# Patient Record
Sex: Male | Born: 1975 | Hispanic: No | Marital: Married | Smoking: Never smoker
Health system: Southern US, Community
[De-identification: ages and names within clinical notes are randomized; demographics above are authoritative.]

## PROBLEM LIST (undated history)

## (undated) DIAGNOSIS — R030 Elevated blood-pressure reading, without diagnosis of hypertension: Principal | ICD-10-CM

## (undated) DIAGNOSIS — K219 Gastro-esophageal reflux disease without esophagitis: Secondary | ICD-10-CM

## (undated) DIAGNOSIS — B019 Varicella without complication: Secondary | ICD-10-CM

## (undated) DIAGNOSIS — T7840XA Allergy, unspecified, initial encounter: Secondary | ICD-10-CM

## (undated) DIAGNOSIS — I10 Essential (primary) hypertension: Secondary | ICD-10-CM

## (undated) HISTORY — DX: Elevated blood-pressure reading, without diagnosis of hypertension: R03.0

## (undated) HISTORY — DX: Essential (primary) hypertension: I10

## (undated) HISTORY — DX: Gastro-esophageal reflux disease without esophagitis: K21.9

## (undated) HISTORY — DX: Allergy, unspecified, initial encounter: T78.40XA

## (undated) HISTORY — PX: NO PAST SURGERIES: SHX2092

## (undated) HISTORY — DX: Varicella without complication: B01.9

---

## 2011-11-21 ENCOUNTER — Ambulatory Visit (INDEPENDENT_AMBULATORY_CARE_PROVIDER_SITE_OTHER): Payer: BC Managed Care – PPO | Admitting: Internal Medicine

## 2011-11-21 ENCOUNTER — Encounter: Payer: Self-pay | Admitting: Internal Medicine

## 2011-11-21 VITALS — BP 120/80 | HR 52 | Temp 97.9°F | Ht 74.0 in | Wt 204.0 lb

## 2011-11-21 DIAGNOSIS — J309 Allergic rhinitis, unspecified: Secondary | ICD-10-CM

## 2011-11-21 DIAGNOSIS — Z0000 Encounter for general adult medical examination without abnormal findings: Secondary | ICD-10-CM | POA: Insufficient documentation

## 2011-11-21 DIAGNOSIS — Z01810 Encounter for preprocedural cardiovascular examination: Secondary | ICD-10-CM | POA: Insufficient documentation

## 2011-11-21 HISTORY — DX: Encounter for general adult medical examination without abnormal findings: Z00.00

## 2011-11-21 HISTORY — DX: Allergic rhinitis, unspecified: J30.9

## 2011-11-21 NOTE — Progress Notes (Signed)
  Subjective:    Patient ID: Shawn Webb, male    DOB: 10/02/1975, 36 y.o.   MRN: [id number]  HPI New patient , CPX  Past medical history Migraines Allergies, on OTCs  Past surgical history No major   Social history teacher , high school, computer science Married , 1 daughter tobacco-- never ETOH-- socially Diet-- healthy most of the time  Exercise-- active, hikes regulalrly  Family history Diabetes-- GP CAD--no Stroke--no Colon cancer--no Prostate cancer--no   Review of Systems  Respiratory: Negative for cough and shortness of breath.   Cardiovascular: Negative for chest pain and leg swelling.  Gastrointestinal: Negative for abdominal pain and blood in stool.  Genitourinary: Negative for dysuria, hematuria and difficulty urinating.  Psychiatric/Behavioral:       No depression or anxiety        Objective:   Physical Exam General -- alert, well-developed, and well-nourished.   Neck --no thyromegaly  Lungs -- normal respiratory effort, no intercostal retractions, no accessory muscle use, and normal breath sounds.   Heart-- normal rate, regular rhythm, no murmur, and no gallop.   Abdomen--soft, non-tender, no distention, no masses, no HSM, no guarding, and no rigidity.   Extremities-- no pretibial edema bilaterally Neurologic-- alert & oriented X3 and strength normal in all extremities. Psych-- Cognition and judgment appear intact. Alert and cooperative with normal attention span and concentration.  not anxious appearing and not depressed appearing.       Assessment & Plan:

## 2011-11-21 NOTE — Patient Instructions (Signed)
Come back fasting: FLP CBC CMP TSH--- dx V70

## 2011-11-21 NOTE — Assessment & Plan Note (Signed)
Currently on OTC, symptoms increase seasonally. Also symptoms are worse at home, he has 2 dogs and one cat. We discussed possible a nasal steroids, he will call if interested

## 2011-11-21 NOTE — Assessment & Plan Note (Signed)
Tdap today Never had a Cscope Continue w/ his healthy life style STE Labs

## 2011-11-23 ENCOUNTER — Encounter: Payer: Self-pay | Admitting: Internal Medicine

## 2011-11-24 ENCOUNTER — Other Ambulatory Visit (INDEPENDENT_AMBULATORY_CARE_PROVIDER_SITE_OTHER): Payer: BC Managed Care – PPO

## 2011-11-24 DIAGNOSIS — Z0000 Encounter for general adult medical examination without abnormal findings: Secondary | ICD-10-CM

## 2011-11-24 LAB — COMPREHENSIVE METABOLIC PANEL
ALT: 20 U/L (ref 0–53)
AST: 20 U/L (ref 0–37)
Alkaline Phosphatase: 90 U/L (ref 39–117)
CO2: 29 mEq/L (ref 19–32)
Sodium: 139 mEq/L (ref 135–145)
Total Bilirubin: 0.6 mg/dL (ref 0.3–1.2)
Total Protein: 7 g/dL (ref 6.0–8.3)

## 2011-11-24 LAB — CBC WITH DIFFERENTIAL/PLATELET
Basophils Absolute: 0 10*3/uL (ref 0.0–0.1)
Eosinophils Absolute: 0.5 10*3/uL (ref 0.0–0.7)
HCT: 43.5 % (ref 39.0–52.0)
Lymphs Abs: 1.6 10*3/uL (ref 0.7–4.0)
Monocytes Relative: 7.8 % (ref 3.0–12.0)
Platelets: 129 10*3/uL — ABNORMAL LOW (ref 150.0–400.0)
RDW: 13.6 % (ref 11.5–14.6)

## 2011-11-24 LAB — LIPID PANEL
HDL: 40.9 mg/dL (ref >39.00)
LDL Cholesterol: 109 mg/dL — ABNORMAL HIGH (ref 0–99)
Total CHOL/HDL Ratio: 4
Triglycerides: 136 mg/dL (ref 0.0–149.0)

## 2012-06-12 ENCOUNTER — Other Ambulatory Visit: Payer: Self-pay

## 2013-03-03 ENCOUNTER — Other Ambulatory Visit: Payer: Self-pay

## 2013-04-29 ENCOUNTER — Ambulatory Visit: Payer: BC Managed Care – PPO | Admitting: Internal Medicine

## 2014-06-08 ENCOUNTER — Ambulatory Visit (INDEPENDENT_AMBULATORY_CARE_PROVIDER_SITE_OTHER): Payer: BC Managed Care – PPO | Admitting: Medical

## 2014-06-08 ENCOUNTER — Encounter: Payer: Self-pay | Admitting: Medical

## 2014-06-08 VITALS — BP 150/101 | HR 58 | Temp 97.9°F | Ht 74.0 in | Wt 221.0 lb

## 2014-06-08 DIAGNOSIS — J0100 Acute maxillary sinusitis, unspecified: Secondary | ICD-10-CM

## 2014-06-08 DIAGNOSIS — J029 Acute pharyngitis, unspecified: Secondary | ICD-10-CM

## 2014-06-08 LAB — POCT RAPID STREP A (OFFICE): Rapid Strep A Screen: NEGATIVE

## 2014-06-08 MED ORDER — FLUTICASONE PROPIONATE 50 MCG/ACT NA SUSP: 2.0000 | Freq: Every day | NASAL | Status: DC

## 2014-06-08 MED ORDER — BENZONATATE 100 MG PO CAPS: 100.0000 mg | ORAL_CAPSULE | Freq: Three times a day (TID) | ORAL | Status: DC | PRN

## 2014-06-08 MED ORDER — AZITHROMYCIN 250 MG PO TABS: ORAL_TABLET | ORAL | Status: DC

## 2014-06-08 NOTE — Patient Instructions (Addendum)
Sinusitis, acute maxillary Your appear to have a sinus infection. I am prescribing  Azithromycin antibiotic for the infection. To help with the nasal congestion I prescribed nasal steroid flonase. For your associated cough, I prescribed cough medicine benzonatate   Rest, hydrate, tylenol for fever.  Follow up in 7 days or as needed.   Pharyngitis Your strep test was negative. However, your physical exam and clinical presentation is suspicious for strep and it is important to note that rapid strep test can be falsely negative. So I am going to give you azithromycin antibiotic today based on your exam and clinical presentation.  Rest hydrate, tylenol for fever, and warm salt water gargles.   Follow up in 7 days or as needed.      Regarding bp . Stop sudafed. Check bp at home. If daily bp 140/90 or greater then would need bp med.

## 2014-06-08 NOTE — Progress Notes (Signed)
Pre visit review using our clinic review tool, if applicable. No additional management support is needed unless otherwise documented below in the visit note. 

## 2014-06-08 NOTE — Progress Notes (Signed)
   Subjective:    Patient ID: Shawn Webb, male    DOB: 10/08/1975, 39 y.o.   MRN: [id number]  HPI   2 days of moderate st. Hurts to swallow saliva or anything. No fever, no chills or body aches. Pt has 39 yo. No rash.  Pt also has some cough congestion and runny nose for 2-3 days. Sinus pain and some teeth sensitivity.  Pt tried sudafed but symptoms persist.    Review of Systems  Constitutional: Negative for fever, chills and fatigue.  HENT: Positive for congestion, rhinorrhea, sinus pressure and sore throat.        Some faint upper teeth pain.  Respiratory: Positive for cough.   Cardiovascular: Negative for chest pain and palpitations.  Musculoskeletal: Negative for myalgias and back pain.  Skin: Negative for rash.  Neurological: Negative for dizziness and headaches.  Hematological: Negative for adenopathy. Does not bruise/bleed easily.    Past Medical History  Diagnosis Date  . Chicken pox   . Allergy     History   Social History  . Marital Status: Married    Spouse Name: N/A  . Number of Children: N/A  . Years of Education: N/A   Occupational History  . Not on file.   Social History Main Topics  . Smoking status: Never Smoker   . Smokeless tobacco: Never Used  . Alcohol Use: Yes  . Drug Use: No  . Sexual Activity: Not on file   Other Topics Concern  . Not on file   Social History Narrative    No past surgical history on file.  No family history on file.  Allergies  Allergen Reactions  . Sulfa Antibiotics     No current outpatient prescriptions on file prior to visit.   No current facility-administered medications on file prior to visit.    BP 150/101 mmHg  Pulse 58  Temp(Src) 97.9 F (36.6 C) (Oral)  Ht 6\' 2"  (1.88 m)  Wt 221 lb (100.245 kg)  BMI 28.36 kg/m2  SpO2 98%       Objective:   Physical Exam  General  Mental Status - Alert. General Appearance - Well groomed. Not in acute distress.  Skin Rashes- No  Rashes.  HEENT Head- Normal. Ear Auditory Canal - Left- Normal. Right - Normal.Tympanic Membrane- Left- Normal. Right- Normal. Eye Sclera/Conjunctiva- Left- Normal. Right- Normal. Nose & Sinuses Nasal Mucosa- Left-  Boggy and Congested. Right-  Boggy and  Congested. No Bilateral maxillary or  frontal sinus pressure.(pain and teeth sensitive yesterday) Mouth & Throat Lips: Upper Lip- Normal: no dryness, cracking, pallor, cyanosis, or vesicular eruption. Lower Lip-Normal: no dryness, cracking, pallor, cyanosis or vesicular eruption. Buccal Mucosa- Bilateral- No Aphthous ulcers. Oropharynx- No Discharge or Erythema. Tonsils: Characteristics- Bilateral- Erythema + Congestion. Size/Enlargement- Bilateral- 1 +enlargement. Discharge- bilateral-None.  Neck Neck- Supple. No Masses.   Chest and Lung Exam Auscultation: Breath Sounds:-Clear even and unlabored.  Cardiovascular Auscultation:Rythm- Regular, rate and rhythm. Murmurs & Other Heart Sounds:Ausculatation of the heart reveal- No Murmurs.  Lymphatic Head & Neck General Head & Neck Lymphatics: Bilateral: Description- No Localized lymphadenopathy.       Assessment & Plan:

## 2014-06-08 NOTE — Assessment & Plan Note (Signed)
Your appear to have a sinus infection. I am prescribing  Azithromycin antibiotic for the infection. To help with the nasal congestion I prescribed nasal steroid flonase. For your associated cough, I prescribed cough medicine benzonatate.  Rest, hydrate, tylenol for fever.  Follow up in 7 days or as needed. 

## 2014-06-08 NOTE — Assessment & Plan Note (Signed)
Your strep test was negative. However, your physical exam and clinical presentation is suspicious for strep and it is important to note that rapid strep test can be falsely negative. So I am going to give you azithromycin  antibiotic today based on your exam and clinical presentation.  Rest hydrate, tylenol for fever, and warm salt water gargles.   Follow up in 7 days or as needed.    

## 2014-08-30 ENCOUNTER — Other Ambulatory Visit: Payer: Self-pay

## 2014-08-31 ENCOUNTER — Encounter: Payer: Self-pay | Admitting: Internal Medicine

## 2014-08-31 ENCOUNTER — Ambulatory Visit (INDEPENDENT_AMBULATORY_CARE_PROVIDER_SITE_OTHER): Payer: BC Managed Care – PPO | Admitting: Internal Medicine

## 2014-08-31 VITALS — BP 128/78 | HR 48 | Temp 97.9°F | Ht 74.0 in | Wt 220.1 lb

## 2014-08-31 DIAGNOSIS — R030 Elevated blood-pressure reading, without diagnosis of hypertension: Secondary | ICD-10-CM

## 2014-08-31 DIAGNOSIS — IMO0001 Reserved for inherently not codable concepts without codable children: Secondary | ICD-10-CM | POA: Insufficient documentation

## 2014-08-31 LAB — CBC WITH DIFFERENTIAL/PLATELET
BASOS PCT: 0.6 % (ref 0.0–3.0)
Basophils Absolute: 0 10*3/uL (ref 0.0–0.1)
EOS ABS: 0.3 10*3/uL (ref 0.0–0.7)
Eosinophils Relative: 5.7 % — ABNORMAL HIGH (ref 0.0–5.0)
HCT: 43.8 % (ref 39.0–52.0)
Hemoglobin: 15.3 g/dL (ref 13.0–17.0)
LYMPHS PCT: 37.3 % (ref 12.0–46.0)
Lymphs Abs: 1.9 10*3/uL (ref 0.7–4.0)
MCHC: 34.9 g/dL (ref 30.0–36.0)
MCV: 85.3 fl (ref 78.0–100.0)
MONO ABS: 0.4 10*3/uL (ref 0.1–1.0)
Monocytes Relative: 8.3 % (ref 3.0–12.0)
Neutro Abs: 2.5 10*3/uL (ref 1.4–7.7)
Neutrophils Relative %: 48.1 % (ref 43.0–77.0)
PLATELETS: 126 10*3/uL — AB (ref 150.0–400.0)
RBC: 5.14 Mil/uL (ref 4.22–5.81)
RDW: 14 % (ref 11.5–15.5)
WBC: 5.2 10*3/uL (ref 4.0–10.5)

## 2014-08-31 LAB — BASIC METABOLIC PANEL
BUN: 17 mg/dL (ref 6–23)
CO2: 27 mEq/L (ref 19–32)
Calcium: 9.5 mg/dL (ref 8.4–10.5)
Chloride: 105 mEq/L (ref 96–112)
Creatinine, Ser: 1.08 mg/dL (ref 0.40–1.50)
GFR: 81.73 mL/min (ref >60.00)
Glucose, Bld: 84 mg/dL (ref 70–99)
Potassium: 4.2 mEq/L (ref 3.5–5.1)
Sodium: 139 mEq/L (ref 135–145)

## 2014-08-31 LAB — TSH: TSH: 1.22 u[IU]/mL (ref 0.35–4.50)

## 2014-08-31 NOTE — Progress Notes (Signed)
   Subjective:    Patient ID: Shawn Webb, male    DOB: 01/25/1976, 39 y.o.   MRN: [id number]  DOS:  08/31/2014 Type of visit - description : Follow-up, BP Interval history: For the last year has noticed his BP to be elevated sometimes, it is usually the diastolic BP that occasionally gets between 90 and 100. [facility] home and [facility]. When he was here for a URI a few months ago, BP was 150/101 but he was taking Sudafed.  Review of Systems Denies chest pain, difficulty breathing or lower extremity edema No major problems with distress No headache or dizziness  Past Medical History  Diagnosis Date  . Chicken pox   . Allergy   . Elevated BP     History reviewed. No pertinent past surgical history.  History   Social History  . Marital Status: Married    Spouse Name: N/A  . Number of Children: 1  . Years of Education: N/A   Occupational History  . teacher training- [employer]    Social History Main Topics  . Smoking status: Never Smoker   . Smokeless tobacco: Never Used  . Alcohol Use: Yes  . Drug Use: No  . Sexual Activity: Not on file   Other Topics Concern  . Not on file   Social History Narrative   Wife is pregnant         Medication List    Notice  As of 08/31/2014  1:45 PM   You have not been prescribed any medications.         Objective:   Physical Exam BP 128/78 mmHg  Pulse 48  Temp(Src) 97.9 F (36.6 C) (Oral)  Ht 6\' 2"  (1.88 m)  Wt 220 lb 2 oz (99.848 kg)  BMI 28.25 kg/m2  SpO2 98% General:   Well developed, well nourished . NAD.  HEENT:  Normocephalic . Face symmetric, atraumatic Lungs:  CTA B Normal respiratory effort, no intercostal retractions, no accessory muscle use. Heart: RRR,  no murmur.  no pretibial edema bilaterally  Neurologic:  alert & oriented X3.  Speech normal, gait appropriate for age and unassisted Psych--  Cognition and judgment appear intact.  Cooperative with normal  attention span and concentration.  Behavior appropriate. No anxious or depressed appearing.       Assessment & Plan:

## 2014-08-31 NOTE — Progress Notes (Signed)
Pre visit review using our clinic review tool, if applicable. No additional management support is needed unless otherwise documented below in the visit note. 

## 2014-08-31 NOTE — Assessment & Plan Note (Signed)
Modestly elevated BP, recommend lifestyle modification first and monitoring his BP. If it is consistently more than 135/85 we agreed that he would try medication. Labs Follow-up 6 months

## 2014-08-31 NOTE — Patient Instructions (Signed)
Get your blood work before you leave   Stay active Try to have a healthier diet, specifically a low-salt diet. Please visit the American Heart Association website, they have great suggestions about a healthy diet.  Check the  blood pressure 2 or 3 times a   week, keep a log   Be sure your blood pressure is between 110/65 and  135/85.  if it is consistently higher or lower, let me know    Come back to the office in 6 months  for a physical exam  Please schedule an appointment at the front desk    Come back fasting

## 2015-03-02 ENCOUNTER — Telehealth: Payer: Self-pay | Admitting: Behavioral Health

## 2015-03-02 NOTE — Telephone Encounter (Signed)
Unable to reach patient at time of Pre-Visit Call.  Left message for patient to return call when available.    

## 2015-03-05 ENCOUNTER — Encounter: Payer: Self-pay | Admitting: Internal Medicine

## 2015-03-05 ENCOUNTER — Ambulatory Visit (INDEPENDENT_AMBULATORY_CARE_PROVIDER_SITE_OTHER): Payer: BC Managed Care – PPO | Admitting: Internal Medicine

## 2015-03-05 VITALS — BP 125/80 | HR 57 | Temp 97.9°F | Ht 73.0 in | Wt 228.0 lb

## 2015-03-05 DIAGNOSIS — Z0000 Encounter for general adult medical examination without abnormal findings: Secondary | ICD-10-CM | POA: Diagnosis not present

## 2015-03-05 DIAGNOSIS — Z09 Encounter for follow-up examination after completed treatment for conditions other than malignant neoplasm: Secondary | ICD-10-CM

## 2015-03-05 HISTORY — DX: Encounter for follow-up examination after completed treatment for conditions other than malignant neoplasm: Z09

## 2015-03-05 LAB — COMPREHENSIVE METABOLIC PANEL
ALK PHOS: 87 U/L (ref 39–117)
ALT: 44 U/L (ref 0–53)
AST: 27 U/L (ref 0–37)
Albumin: 4.3 g/dL (ref 3.5–5.2)
BUN: 11 mg/dL (ref 6–23)
CHLORIDE: 107 meq/L (ref 96–112)
CO2: 30 meq/L (ref 19–32)
Calcium: 10.3 mg/dL (ref 8.4–10.5)
Creatinine, Ser: 0.99 mg/dL (ref 0.40–1.50)
GFR: 90.12 mL/min (ref >60.00)
GLUCOSE: 93 mg/dL (ref 70–99)
POTASSIUM: 4.8 meq/L (ref 3.5–5.1)
SODIUM: 143 meq/L (ref 135–145)
Total Bilirubin: 0.3 mg/dL (ref 0.2–1.2)
Total Protein: 6.8 g/dL (ref 6.0–8.3)

## 2015-03-05 LAB — LIPID PANEL
CHOL/HDL RATIO: 6
Cholesterol: 209 mg/dL — ABNORMAL HIGH (ref 0–200)
HDL: 37 mg/dL — ABNORMAL LOW (ref >39.00)
LDL Cholesterol: 134 mg/dL — ABNORMAL HIGH (ref 0–99)
NONHDL: 171.92
Triglycerides: 190 mg/dL — ABNORMAL HIGH (ref 0.0–149.0)
VLDL: 38 mg/dL (ref 0.0–40.0)

## 2015-03-05 NOTE — Progress Notes (Signed)
Pre visit review using our clinic review tool, if applicable. No additional management support is needed unless otherwise documented below in the visit note. 

## 2015-03-05 NOTE — Patient Instructions (Signed)
Get your blood work before you leave     Check the  blood pressure 2 or 3 times a year  Be sure your blood pressure is between 110/65 and  145/85.  if it is consistently higher or lower, let me know   Next visit  for a      physical exam in one year   Please schedule an appointment at the front desk Please come back fasting

## 2015-03-05 NOTE — Assessment & Plan Note (Signed)
Elevated BP before, now essentially normal, recommend to check ambulatory BPs from time to time Episode of dizziness, peripheral most likely, recommend observation RTC 1 year.

## 2015-03-05 NOTE — Progress Notes (Signed)
Subjective:    Patient ID: Shawn Webb, male    DOB: 03-Oct-1975, 39 y.o.   MRN: [id number]  DOS:  03/05/2015 Type of visit - description : CPX Interval history: BP was elevated at some point, he checked consistently for a while and BPs were normal. No recent BP checks.   Review of Systems Constitutional: No fever. No chills. No unexplained wt changes. No unusual sweats  HEENT: No dental problems, no ear discharge, no facial swelling, no voice changes. No eye discharge, no eye  redness , no  intolerance to light   Respiratory: No wheezing , no  difficulty breathing. No cough , no mucus production  Cardiovascular: No CP, no leg swelling , no  Palpitations  GI: no nausea, no vomiting, no diarrhea , no  abdominal pain.  No blood in the stools. No dysphagia, no odynophagia    Endocrine: No polyphagia, no polyuria , no polydipsia  GU: No dysuria, gross hematuria, difficulty urinating. No urinary urgency, no frequency.  Musculoskeletal: No joint swellings or unusual aches or pains  Skin: No change in the color of the skin, palor , no  Rash  Allergic, immunologic: No environmental allergies , no  food allergies  Neurological:  Felt dizzy  few weeks ago, symptoms lasted 3 or 4 days, a spinning feeling when he stood up or move his head. No associated symptoms such as syncope. Symptoms self resolved.   no  syncope. No headaches. No diplopia, no slurred, no slurred speech, no motor deficits, no facial  Numbness  Hematological: No enlarged lymph nodes, no easy bruising , no unusual bleedings  Psychiatry: No suicidal ideas, no hallucinations, no beavior problems, no confusion.  No unusual/severe anxiety, no depression   Past Medical History  Diagnosis Date  . Chicken pox   . Allergy   . Elevated BP     Past Surgical History  Procedure Laterality Date  . No past surgeries      Social History   Social History  . Marital Status: Married    Spouse Name: N/A  . Number of  Children: 2  . Years of Education: N/A   Occupational History  . teacher trainner-- [employer]    Social History Main Topics  . Smoking status: Never Smoker   . Smokeless tobacco: Never Used  . Alcohol Use: Yes  . Drug Use: No  . Sexual Activity: Not on file   Other Topics Concern  . Not on file   Social History Narrative         Family History  Problem Relation Age of Onset  . CAD Neg Hx   . Diabetes Neg Hx        Medication List       This list is accurate as of: 03/05/15  7:55 PM.  Always use your most recent med list.               cetirizine 5 MG tablet  Commonly known as:  ZYRTEC  Take 5 mg by mouth daily.           Objective:   Physical Exam BP 125/80 mmHg  Pulse 57  Temp(Src) 97.9 F (36.6 C)  Ht  (1.854 m)  Wt 228 lb (103.42 kg)  BMI 30.09 kg/m2  SpO2 98% General:   Well developed, well nourished . NAD.  Neck:  No thyromegaly HEENT:  Normocephalic . Face symmetric, atraumatic Lungs:  CTA B Normal respiratory effortOTILIO GROLEAUstal retractions, no accessory muscle  use. Heart: RRR,  no murmur.  No pretibial edema bilaterally  Abdomen:  Not distended, soft, non-tender. No rebound or rigidity. No mass,organomegaly Skin: Exposed areas without rash. Not pale. Not jaundice Neurologic:  alert & oriented X3.  Speech normal, gait appropriate for age and unassisted. EOMI, pupils equal and reactive Strength symmetric and appropriate for age.  Psych: Cognition and judgment appear intact.  Cooperative with normal attention span and concentration.  Behavior appropriate. No anxious or depressed appearing.    Assessment & Plan:   Assessment > Elevated BP x 1-2 times 2016 Blood donor (q 3 months)  Plan: Elevated BP before, now essentially normal, recommend to check ambulatory BPs from time to time Episode of dizziness, peripheral most likely, recommend observation RTC 1 year.

## 2015-03-05 NOTE — Assessment & Plan Note (Signed)
Tdap 2013 Never had a Cscope STE discussed  exercise discussed, used to be more active but has a small baby at home Diet- counseled  Labs

## 2015-03-06 ENCOUNTER — Encounter: Payer: Self-pay | Admitting: Internal Medicine

## 2016-03-06 ENCOUNTER — Encounter: Payer: BC Managed Care – PPO | Admitting: Internal Medicine

## 2016-05-05 ENCOUNTER — Ambulatory Visit (INDEPENDENT_AMBULATORY_CARE_PROVIDER_SITE_OTHER): Payer: BC Managed Care – PPO | Admitting: Internal Medicine

## 2016-05-05 ENCOUNTER — Encounter: Payer: Self-pay | Admitting: Internal Medicine

## 2016-05-05 VITALS — BP 120/80 | HR 48 | Temp 97.6°F | Resp 14 | Ht 73.0 in | Wt 224.1 lb

## 2016-05-05 DIAGNOSIS — Z0000 Encounter for general adult medical examination without abnormal findings: Secondary | ICD-10-CM

## 2016-05-05 DIAGNOSIS — Z114 Encounter for screening for human immunodeficiency virus [HIV]: Secondary | ICD-10-CM

## 2016-05-05 NOTE — Progress Notes (Signed)
Subjective:    Patient ID: Shawn CablesMichael C Grillo, male    DOB: 07/02/1975, 41 y.o.   MRN: [id number]  DOS:  05/05/2016 Type of visit - description : cpx Interval history: No major concerns  Wt Readings from Last 3 Encounters:  05/05/16 224 lb 2 oz (101.7 kg)  03/05/15 228 lb (103.4 kg)  08/31/14 220 lb 2 oz (99.8 kg)     Review of Systems  General:   Well developed, well nourished . NAD.  Neck: No  thyromegaly  HEENT:  Normocephalic . Face symmetric, atraumatic Lungs:  CTA B Normal respiratory effort, no intercostal retractions, no accessory muscle use. Heart: RRR,  no murmur.  No pretibial edema bilaterally  Abdomen:  Not distended, soft, non-tender. No rebound or rigidity.   Skin: Exposed areas without rash. Not pale. Not jaundice Neurologic:  alert & oriented X3.  Speech normal, gait appropriate for age and unassisted Strength symmetric and appropriate for age.  Psych: Cognition and judgment appear intact.  Cooperative with normal attention span and concentration.  Behavior appropriate. No anxious or depressed appearing.  Past Medical History:  Diagnosis Date  . Allergy   . Chicken pox   . Elevated BP     Past Surgical History:  Procedure Laterality Date  . NO PAST SURGERIES      Social History   Social History  . Marital status: Married    Spouse name: N/A  . Number of children: 2  . Years of education: N/A   Occupational History  . 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 Smoking status: Never Smoker  . Smokeless tobacco: Never Used  . Alcohol use Yes  . Drug use: No  . Sexual activity: Not on file   Other Topics Concern  . Not on file   Social History Narrative         Family History  Problem Relation Age of Onset  . Colon cancer Maternal Grandfather     late onset   . CAD Neg Hx   . Diabetes Neg Hx   . Prostate cancer Neg Hx      Allergies as of 05/05/2016      Reactions   Sulfa Antibiotics Other (See Comments)   Unknown      Medication List       Accurate as of 05/05/16 11:59 PM. Always use your most recent med list.          cetirizine 5 MG tablet Commonly known as:  ZYRTEC Take 5 mg by mouth daily.          Objective:   Physical Exam BP 120/80 (BP Location: Right Arm, Patient Position: Sitting, Cuff Size: Normal)   Pulse (!) 48   Temp 97.6 F (36.4 C) (Oral)   Resp 14   Ht 6\' 1"  (1.854 m)   Wt 224 lb 2 oz (101.7 kg)   SpO2 98%   BMI 29.57 kg/m  General:   Well developed, well nourished . NAD.  Neck: No  thyromegaly  HEENT:  Normocephalic . Face symmetric, atraumatic Lungs:  CTA B Normal respiratory effort, no intercostal retractions, no accessory muscle use. Heart: RRR,  no murmur.  No pretibial edema bilaterally  Abdomen:  Not distended, soft, non-tender. No rebound or rigidity.   Skin: Exposed areas without rash. Not pale. Not jaundice Neurologic:  alert & oriented X3.  Speech normal, gait appropriate for age and unassisted Strength symmetric and appropriate for age.  Psych: Cognition  and judgment appear intact.  Cooperative with normal attention span and concentration.  Behavior appropriate. No anxious or depressed appearing.     Assessment & Plan:   Assessment > Elevated BP x 1-2 times 2016 Blood donor (q 3 months)  PLAN: Here for a CPX Elevated BP: he donates blood  blood frequently, occasionally BPs are high there but otherwise ambulatory BPs at home are normal. Rec to continue monitoring. RTC one year

## 2016-05-05 NOTE — Assessment & Plan Note (Addendum)
Tdap 2013; had a flu shot  Never had a Cscope Does practice STE, checks are okay Exercise- counseled  Diet- counseled  Labs : BMP, FLP, HIV

## 2016-05-05 NOTE — Patient Instructions (Signed)
GO TO THE LAB : Get the blood work     GO TO THE FRONT DESK Schedule your next appointment for a  physical exam in one year  

## 2016-05-05 NOTE — Progress Notes (Signed)
Pre visit review using our clinic review tool, if applicable. No additional management support is needed unless otherwise documented below in the visit note. 

## 2016-05-06 LAB — BASIC METABOLIC PANEL
BUN: 15 mg/dL (ref 6–23)
CALCIUM: 10.1 mg/dL (ref 8.4–10.5)
CO2: 29 mEq/L (ref 19–32)
Chloride: 103 mEq/L (ref 96–112)
Creatinine, Ser: 1.07 mg/dL (ref 0.40–1.50)
GFR: 81.88 mL/min (ref >60.00)
GLUCOSE: 83 mg/dL (ref 70–99)
POTASSIUM: 4.3 meq/L (ref 3.5–5.1)
SODIUM: 142 meq/L (ref 135–145)

## 2016-05-06 LAB — LIPID PANEL
CHOLESTEROL: 234 mg/dL — AB (ref 0–200)
HDL: 41 mg/dL (ref >39.00)
NonHDL: 192.93
Total CHOL/HDL Ratio: 6
Triglycerides: 207 mg/dL — ABNORMAL HIGH (ref 0.0–149.0)
VLDL: 41.4 mg/dL — ABNORMAL HIGH (ref 0.0–40.0)

## 2016-05-06 LAB — HIV ANTIBODY (ROUTINE TESTING W REFLEX): HIV 1&2 Ab, 4th Generation: NONREACTIVE

## 2016-05-06 LAB — LDL CHOLESTEROL, DIRECT: Direct LDL: 160 mg/dL

## 2016-05-06 NOTE — Assessment & Plan Note (Signed)
Here for a CPX Elevated BP: he donates blood  blood frequently, occasionally BPs are high there but otherwise ambulatory BPs at home are normal. Rec to continue monitoring. RTC one year

## 2016-11-24 ENCOUNTER — Telehealth: Payer: Self-pay | Admitting: Internal Medicine

## 2016-11-24 NOTE — Telephone Encounter (Signed)
°  Relation to JX:BJYNpt:self Call back number:[phone]   Reason for call:  Patient emailed Health Examination Form,placed in Provider/paperwork bin located at the front desk for PCP review

## 2016-11-25 NOTE — Telephone Encounter (Signed)
Completed as much as possible and forwarded to provider with CPE notes; pt will need vision & hearing test; no immunizations documented in chart and/or [facility], will need records and/or Titiers drawn [reported at physical of having Tetanus in 2013]; forwarded to provider/SLS 07/31

## 2016-11-27 NOTE — Telephone Encounter (Signed)
Patient scheduled with PCP for 12/01/16 at 1pm 30 minute.

## 2016-11-27 NOTE — Telephone Encounter (Signed)
Needs appt to complete form, missing TDAP (tetanus), MMR, Hep B immunity information, vision and hearing testing, and TB testing. Needs 30 min appt w/ PCP. Please schedule at Pt's convenience. Let me know if he has questions. Thank you.

## 2016-12-01 ENCOUNTER — Ambulatory Visit (INDEPENDENT_AMBULATORY_CARE_PROVIDER_SITE_OTHER): Payer: BC Managed Care – PPO | Admitting: Internal Medicine

## 2016-12-01 ENCOUNTER — Encounter: Payer: Self-pay | Admitting: Internal Medicine

## 2016-12-01 VITALS — BP 124/68 | HR 58 | Temp 98.1°F | Resp 14 | Ht 73.0 in | Wt 221.4 lb

## 2016-12-01 DIAGNOSIS — Z111 Encounter for screening for respiratory tuberculosis: Secondary | ICD-10-CM

## 2016-12-01 DIAGNOSIS — R030 Elevated blood-pressure reading, without diagnosis of hypertension: Secondary | ICD-10-CM

## 2016-12-01 DIAGNOSIS — Z789 Other specified health status: Secondary | ICD-10-CM

## 2016-12-01 DIAGNOSIS — Z23 Encounter for immunization: Secondary | ICD-10-CM

## 2016-12-01 NOTE — Progress Notes (Signed)
   Subjective:    Patient ID: Shawn Webb, male    DOB: [birth year], 41 y.o.   MRN: [id number]  DOS:  12/01/2016 Type of visit - description : Health examination certificate Interval history: Patient to start a new job tomorrow. Feeling great. Denies fever, chills. No headache or rash.  Review of Systems   Past Medical History:  Diagnosis Date  . Allergy   . Chicken pox   . Elevated BP     Past Surgical History:  Procedure Laterality Date  . NO PAST SURGERIES      Social History   Social History  . Marital status: Married    Spouse name: N/A  . Number of children: 2  . Years of education: N/A   Occupational History  . 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 Smoking status: Never Smoker  . Smokeless tobacco: Never Used  . Alcohol use Yes  . Drug use: No  . Sexual activity: Not on file   Other Topics Concern  . Not on file   Social History Narrative          Allergies as of 12/01/2016      Reactions   Sulfa Antibiotics Other (See Comments)   Unknown      Medication List       Accurate as of 12/01/16  1:13 PM. Always use your most recent med list.          cetirizine 5 MG tablet Commonly known as:  ZYRTEC Take 5 mg by mouth daily.          Objective:   Physical Exam BP 124/68 (BP Location: Left Arm, Patient Position: Sitting, Cuff Size: Small)   Pulse (!) 58   Temp 98.1 F (36.7 C) (Oral)   Resp 14   Ht '6\' 1"'$  (1.854 m)   Wt 221 lb 6 oz (100.4 kg)   SpO2 98%   BMI 29.21 kg/m  General:   Well developed, well nourished . NAD.  HEENT:  Normocephalic . Face symmetric, atraumatic Lungs:  CTA B Normal respiratory effort, no intercostal retractions, no accessory muscle use. Heart: RRR,  no murmur.  No pretibial edema bilaterally  Skin: Not pale. Not jaundice Neurologic:  alert & oriented X3.  Speech normal, gait appropriate for age and unassisted Psych--  Cognition and judgment appear intact.  Cooperative with normal  attention span and concentration.  Behavior appropriate. No anxious or depressed appearing.      Assessment & Plan:    Assessment Elevated BP x 1-2 times 2016 Blood donor (q 3 months)  PLAN: Elevated BP: BP wnl Health examination certificate Patient is doing well, vision exam wnl with glasses. Tdap today. Check serology for MMR, hep B and TB gold.

## 2016-12-01 NOTE — Patient Instructions (Addendum)
Please go to the third floor, [facility] to get labs  ([address])

## 2016-12-01 NOTE — Progress Notes (Signed)
Pre visit review using our clinic review tool, if applicable. No additional management support is needed unless otherwise documented below in the visit note. 

## 2016-12-02 LAB — MEASLES/MUMPS/RUBELLA IMMUNITY
MUMPS ABS, IGG: 48.4 AU/mL (ref >10.9)
RUBEOLA AB, IGG: 61.2 AU/mL (ref >29.9)
Rubella Antibodies, IGG: 1.65 index (ref >0.99)

## 2016-12-02 LAB — HEPATITIS B SURFACE ANTIGEN: Hepatitis B Surface Ag: NEGATIVE

## 2016-12-02 LAB — HEPATITIS B CORE ANTIBODY, TOTAL: Hep B Core Total Ab: NEGATIVE

## 2016-12-02 NOTE — Assessment & Plan Note (Signed)
Elevated BP: BP wnl Health examination certificate Patient is doing well, vision exam wnl with glasses. Tdap today. Check serology for MMR, hep B and TB gold.

## 2016-12-05 LAB — QUANTIFERON IN TUBE
QFT TB AG MINUS NIL VALUE: 0 [IU]/mL
QUANTIFERON NIL VALUE: 0.04 [IU]/mL
QUANTIFERON TB AG VALUE: 0.04 [IU]/mL
QUANTIFERON TB GOLD: NEGATIVE

## 2016-12-05 LAB — QUANTIFERON TB GOLD ASSAY (BLOOD)

## 2016-12-05 NOTE — Telephone Encounter (Signed)
See results

## 2016-12-05 NOTE — Telephone Encounter (Signed)
LMOM informing Pt to return call.  

## 2016-12-05 NOTE — Telephone Encounter (Signed)
Result Note   TB test negative, does not need MMR.   I did not obtain a hepatitis B surface antibody to document immunity due to vaccination. Please arrange.  Papers will be fill w/ pending hepatitis B documentation.

## 2016-12-05 NOTE — Telephone Encounter (Signed)
Please advise on results. Pt waiting on form for employment.

## 2016-12-08 NOTE — Telephone Encounter (Signed)
Spoke w/ Pt, informed of results, instructed form ready for pick up at front desk. Copy of form sent for scanning.

## 2016-12-08 NOTE — Telephone Encounter (Signed)
Patient is calling to check the status of the form please advise

## 2017-05-07 ENCOUNTER — Encounter: Payer: BC Managed Care – PPO | Admitting: Internal Medicine

## 2017-05-12 ENCOUNTER — Encounter: Payer: BC Managed Care – PPO | Admitting: Internal Medicine

## 2017-05-29 ENCOUNTER — Encounter: Payer: BC Managed Care – PPO | Admitting: Internal Medicine

## 2017-06-08 ENCOUNTER — Ambulatory Visit (INDEPENDENT_AMBULATORY_CARE_PROVIDER_SITE_OTHER): Payer: BC Managed Care – PPO | Admitting: Internal Medicine

## 2017-06-08 ENCOUNTER — Encounter: Payer: Self-pay | Admitting: Internal Medicine

## 2017-06-08 VITALS — BP 128/76 | HR 66 | Temp 98.0°F | Resp 14 | Ht 73.0 in | Wt 213.4 lb

## 2017-06-08 DIAGNOSIS — Z0001 Encounter for general adult medical examination with abnormal findings: Secondary | ICD-10-CM | POA: Diagnosis not present

## 2017-06-08 DIAGNOSIS — L231 Allergic contact dermatitis due to adhesives: Secondary | ICD-10-CM

## 2017-06-08 DIAGNOSIS — Z0000 Encounter for general adult medical examination without abnormal findings: Secondary | ICD-10-CM

## 2017-06-08 MED ORDER — BETAMETHASONE DIPROPIONATE AUG 0.05 % EX CREA: TOPICAL_CREAM | Freq: Two times a day (BID) | CUTANEOUS | 0 refills | Status: DC

## 2017-06-08 NOTE — Assessment & Plan Note (Addendum)
-  Tdap 2013; had a flu shot  -CCS: no FH, never had a Cscope -Labs CMP, FLP, CBC. -Diet and exercise discussed

## 2017-06-08 NOTE — Progress Notes (Signed)
Subjective:    Patient ID: Shawn Webb, male    DOB: July 11, 1975, 42 y.o.   MRN: [id number]  DOS:  06/08/2017 Type of visit - description : cpx Interval history: In general feeling well, no concerns, ambulatory BPs are usually normal.  He is doing better with diet and trying to exercise regularly   Review of Systems 2 weeks ago, he donated blood, shortly after developed a rash where he had the tape, now the area is also itching.  This is the first time he had a reaction like that.  Other than above, a 14 point review of systems is negative      Past Medical History:  Diagnosis Date  . Allergy   . Chicken pox   . Elevated BP     Past Surgical History:  Procedure Laterality Date  . NO PAST SURGERIES      Social History   Socioeconomic History  . Marital status: Married    Spouse name: Not on file  . Number of children: 2  . Years of education: Not on file  . Highest education level: Not on file  Social Needs  . Financial resource strain: Not on file  . Food insecurity - worry: Not on file  . Food insecurity - inability: Not on file  . Transportation needs - medical: Not on file  . Transportation needs - non-medical: Not on file  Occupational History  . Occupation: Runner, broadcasting/film/video, [employer], [employer]  Tobacco Use  . Smoking status: Never Smoker  . Smokeless tobacco: Never Used  Substance and Sexual Activity  . Alcohol use: Yes    Comment: socially   . Drug use: No  . Sexual activity: Not on file  Other Topics Concern  . Not on file  Social History Narrative         Family History  Problem Relation Age of Onset  . Colon cancer Maternal Grandfather        late onset   . CAD Neg Hx   . Diabetes Neg Hx   . Prostate cancer Neg Hx      Allergies as of 06/08/2017      Reactions   Sulfa Antibiotics Other (See Comments)   Unknown      Medication List        Accurate as of 06/08/17 11:59 PM. Always use your most recent med list.          augmented betamethasone dipropionate 0.05 % cream Commonly known as:  DIPROLENE-AF Apply topically 2 (two) times daily.   cetirizine 5 MG tablet Commonly known as:  ZYRTEC Take 5 mg by mouth daily.          Objective:   Physical Exam  Skin:      BP 128/76 (BP Location: Left Arm, Patient Position: Sitting, Cuff Size: Small)   Pulse 66   Temp 98 F (36.7 C) (Oral)   Resp 14   Ht 6\' 1"  (1.854 m)   Wt 213 lb 6 oz (96.8 kg)   BMI 28.15 kg/m  General:   Well developed, well nourished . NAD.  Neck: No  thyromegaly  HEENT:  Normocephalic . Face symmetric, atraumatic.  Nose is slightly congested.  TMs normal, throat symmetric, no red Lungs:  CTA B Normal respiratory effort, no intercostal retractions, no accessory muscle use. Heart: RRR,  no murmur.  No pretibial edema bilaterally  Abdomen:  Not distended, soft, non-tender. No rebound or rigidity.   Skin: See graphic  neurologic:  alert & oriented X3.  Speech normal, gait appropriate for age and unassisted Strength symmetric and appropriate for age.  Psych: Cognition and judgment appear intact.  Cooperative with normal attention span and concentration.  Behavior appropriate. No anxious or depressed appearing.     Assessment & Plan:   Assessment Elevated BP x 1-2 times 2016 Blood donor (q 3 months) Mild thrombocytopenia Allergic rhinitis  PLAN: Elevated BP: Not an issue at this time.  Doing well with lifestyle Mild thrombocytopenia: I specifically asked about problems with bleeding, denies.  Check a CBC q year Allergic reaction to a tape: rx steroid topically, call if not improving. RTC 1 year

## 2017-06-08 NOTE — Progress Notes (Signed)
Pre visit review using our clinic review tool, if applicable. No additional management support is needed unless otherwise documented below in the visit note. 

## 2017-06-08 NOTE — Patient Instructions (Signed)
GO TO THE LAB : Get the blood work     GO TO THE FRONT DESK Schedule your next appointment   for a physical exam in 1 year 

## 2017-06-09 LAB — CBC WITH DIFFERENTIAL/PLATELET
Basophils Absolute: 0.1 10*3/uL (ref 0.0–0.1)
Basophils Relative: 0.7 % (ref 0.0–3.0)
EOS ABS: 0.4 10*3/uL (ref 0.0–0.7)
Eosinophils Relative: 5 % (ref 0.0–5.0)
HCT: 43.4 % (ref 39.0–52.0)
HEMOGLOBIN: 14.7 g/dL (ref 13.0–17.0)
LYMPHS ABS: 2.1 10*3/uL (ref 0.7–4.0)
Lymphocytes Relative: 25.8 % (ref 12.0–46.0)
MCHC: 33.8 g/dL (ref 30.0–36.0)
MCV: 89.4 fl (ref 78.0–100.0)
MONO ABS: 0.5 10*3/uL (ref 0.1–1.0)
Monocytes Relative: 6.4 % (ref 3.0–12.0)
Neutro Abs: 5.1 10*3/uL (ref 1.4–7.7)
Neutrophils Relative %: 62.1 % (ref 43.0–77.0)
Platelets: 139 10*3/uL — ABNORMAL LOW (ref 150.0–400.0)
RBC: 4.86 Mil/uL (ref 4.22–5.81)
RDW: 13.7 % (ref 11.5–15.5)
WBC: 8.2 10*3/uL (ref 4.0–10.5)

## 2017-06-09 LAB — COMPREHENSIVE METABOLIC PANEL
ALT: 25 U/L (ref 0–53)
AST: 19 U/L (ref 0–37)
Albumin: 4.4 g/dL (ref 3.5–5.2)
Alkaline Phosphatase: 94 U/L (ref 39–117)
BUN: 15 mg/dL (ref 6–23)
CO2: 30 meq/L (ref 19–32)
Calcium: 9.4 mg/dL (ref 8.4–10.5)
Chloride: 104 mEq/L (ref 96–112)
Creatinine, Ser: 1.12 mg/dL (ref 0.40–1.50)
GFR: 77.23 mL/min (ref >60.00)
Glucose, Bld: 85 mg/dL (ref 70–99)
POTASSIUM: 3.9 meq/L (ref 3.5–5.1)
SODIUM: 140 meq/L (ref 135–145)
TOTAL PROTEIN: 6.9 g/dL (ref 6.0–8.3)
Total Bilirubin: 0.7 mg/dL (ref 0.2–1.2)

## 2017-06-09 LAB — LIPID PANEL
Cholesterol: 178 mg/dL (ref 0–200)
HDL: 33.9 mg/dL — AB (ref >39.00)
LDL CALC: 107 mg/dL — AB (ref 0–99)
NonHDL: 144.06
Total CHOL/HDL Ratio: 5
Triglycerides: 187 mg/dL — ABNORMAL HIGH (ref 0.0–149.0)
VLDL: 37.4 mg/dL (ref 0.0–40.0)

## 2017-06-09 NOTE — Assessment & Plan Note (Signed)
Elevated BP: Not an issue at this time.  Doing well with lifestyle Mild thrombocytopenia: I specifically asked about problems with bleeding, denies.  Check a CBC q year Allergic reaction to a tape: rx steroid topically, call if not improving. RTC 1 year

## 2017-07-02 ENCOUNTER — Telehealth: Payer: Self-pay

## 2017-07-02 DIAGNOSIS — S99921A Unspecified injury of right foot, initial encounter: Secondary | ICD-10-CM

## 2017-07-02 NOTE — Telephone Encounter (Signed)
Podiatrist!

## 2017-07-02 NOTE — Telephone Encounter (Signed)
Please advise 

## 2017-07-02 NOTE — Telephone Encounter (Signed)
Copied from CRM [id number]. Topic: Referral - Request >> Jul 02, 2017 10:24 AM Oneal GroutSebastian, Jennifer S wrote: Reason for CRM: Patient hit toe(right bit toe) on ground, last week. Not getting any better. Requesting referral to specialists, ortho or podiatrists. Please advise

## 2017-07-02 NOTE — Telephone Encounter (Signed)
Referral placed.

## 2018-06-09 ENCOUNTER — Encounter: Payer: BC Managed Care – PPO | Admitting: Internal Medicine

## 2018-06-17 ENCOUNTER — Encounter: Payer: Self-pay | Admitting: Internal Medicine

## 2018-06-17 ENCOUNTER — Ambulatory Visit (INDEPENDENT_AMBULATORY_CARE_PROVIDER_SITE_OTHER): Payer: BC Managed Care – PPO | Admitting: Internal Medicine

## 2018-06-17 VITALS — BP 124/68 | HR 43 | Temp 97.5°F | Resp 16 | Ht 73.0 in | Wt 232.1 lb

## 2018-06-17 DIAGNOSIS — Z0000 Encounter for general adult medical examination without abnormal findings: Secondary | ICD-10-CM | POA: Diagnosis not present

## 2018-06-17 LAB — CBC WITH DIFFERENTIAL/PLATELET
Basophils Absolute: 0 10*3/uL (ref 0.0–0.1)
Basophils Relative: 0.6 % (ref 0.0–3.0)
Eosinophils Absolute: 0.2 10*3/uL (ref 0.0–0.7)
Eosinophils Relative: 4.1 % (ref 0.0–5.0)
HCT: 45.5 % (ref 39.0–52.0)
Hemoglobin: 15.3 g/dL (ref 13.0–17.0)
Lymphocytes Relative: 36.7 % (ref 12.0–46.0)
Lymphs Abs: 1.8 10*3/uL (ref 0.7–4.0)
MCHC: 33.7 g/dL (ref 30.0–36.0)
MCV: 89.1 fl (ref 78.0–100.0)
Monocytes Absolute: 0.3 10*3/uL (ref 0.1–1.0)
Monocytes Relative: 7 % (ref 3.0–12.0)
NEUTROS PCT: 51.6 % (ref 43.0–77.0)
Neutro Abs: 2.6 10*3/uL (ref 1.4–7.7)
Platelets: 132 10*3/uL — ABNORMAL LOW (ref 150.0–400.0)
RBC: 5.1 Mil/uL (ref 4.22–5.81)
RDW: 14.4 % (ref 11.5–15.5)
WBC: 5 10*3/uL (ref 4.0–10.5)

## 2018-06-17 LAB — LIPID PANEL
Cholesterol: 196 mg/dL (ref 0–200)
HDL: 36.7 mg/dL — AB (ref >39.00)
LDL Cholesterol: 121 mg/dL — ABNORMAL HIGH (ref 0–99)
NonHDL: 159.23
Total CHOL/HDL Ratio: 5
Triglycerides: 190 mg/dL — ABNORMAL HIGH (ref 0.0–149.0)
VLDL: 38 mg/dL (ref 0.0–40.0)

## 2018-06-17 LAB — COMPREHENSIVE METABOLIC PANEL
ALT: 38 U/L (ref 0–53)
AST: 19 U/L (ref 0–37)
Albumin: 4.4 g/dL (ref 3.5–5.2)
Alkaline Phosphatase: 81 U/L (ref 39–117)
BILIRUBIN TOTAL: 0.4 mg/dL (ref 0.2–1.2)
BUN: 17 mg/dL (ref 6–23)
CO2: 29 mEq/L (ref 19–32)
Calcium: 9.3 mg/dL (ref 8.4–10.5)
Chloride: 105 mEq/L (ref 96–112)
Creatinine, Ser: 1.11 mg/dL (ref 0.40–1.50)
GFR: 73.05 mL/min (ref >60.00)
Glucose, Bld: 83 mg/dL (ref 70–99)
Potassium: 4.3 mEq/L (ref 3.5–5.1)
Sodium: 140 mEq/L (ref 135–145)
TOTAL PROTEIN: 6.7 g/dL (ref 6.0–8.3)

## 2018-06-17 LAB — TSH: TSH: 1.19 u[IU]/mL (ref 0.35–4.50)

## 2018-06-17 MED ORDER — BETAMETHASONE DIPROPIONATE AUG 0.05 % EX CREA: TOPICAL_CREAM | Freq: Two times a day (BID) | CUTANEOUS | 3 refills | Status: DC

## 2018-06-17 NOTE — Progress Notes (Signed)
Subjective:    Patient ID: Shawn Webb, male    DOB: 1976-04-05, 43 y.o.   MRN: [id number]  DOS:  06/17/2018 Type of visit - description: CPX No major concerns  Review of Systems From time to develops a rash on the inner left elbow, need to spontaneously resolve usually.  Mildly pruritic.  Other than above, a 14 point review of systems is negative   Past Medical History:  Diagnosis Date  . Allergy   . Chicken pox   . Elevated BP     Past Surgical History:  Procedure Laterality Date  . NO PAST SURGERIES      Social History   Socioeconomic History  . Marital status: Married    Spouse name: Not on file  . Number of children: 2  . Years of education: Not on file  . Highest education level: Not on file  Occupational History  . Occupation: Runner, broadcasting/film/video, [employer], [employer]  Social Needs  . Financial resource strain: Not on file  . Food insecurity:    Worry: Not on file    Inability: Not on file  . Transportation needs:    Medical: Not on file    Non-medical: Not on file  Tobacco Use  . Smoking status: Never Smoker  . Smokeless tobacco: Never Used  Substance and Sexual Activity  . Alcohol use: Yes    Comment: socially   . Drug use: No  . Sexual activity: Not on file  Lifestyle  . Physical activity:    Days per week: Not on file    Minutes per session: Not on file  . Stress: Not on file  Relationships  . Social connections:    Talks on phone: Not on file    Gets together: Not on file    Attends religious service: Not on file    Active member of club or organization: Not on file    Attends meetings of clubs or organizations: Not on file    Relationship status: Not on file  . Intimate partner violence:    Fear of current or ex partner: Not on file    Emotionally abused: Not on file    Physically abused: Not on file    Forced sexual activity: Not on file  Other Topics Concern  . Not on file  Social History Narrative   Children: 2011, 2016     Plays outdoor tennis, hiker.   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 Colon cancer Maternal Grandfather 48  . CAD Neg Hx   . Diabetes Neg Hx   . Prostate cancer Neg Hx      Allergies as of 06/17/2018      Reactions   Sulfa Antibiotics Other (See Comments)   Unknown      Medication List       Accurate as of June 17, 2018 11:59 PM. Always use your most recent med list.        augmented betamethasone dipropionate 0.05 % cream Commonly known as:  DIPROLENE-AF Apply topically 2 (two) times daily.   cetirizine 5 MG tablet Commonly known as:  ZYRTEC Take 5 mg by mouth daily.           Objective:   Physical Exam Skin:        BP 124/68 (BP Location: Left Arm, Patient Position:  Sitting, Cuff Size: Normal)   Pulse (!) 43   Temp (!) 97.5 F (36.4 C) (Oral)   Resp 16   Ht 6\' 1"  (1.854 m)   Wt 232 lb 2 oz (105.3 kg)   SpO2 98%   BMI 30.63 kg/m  General: Well developed, NAD, BMI noted Neck: No  thyromegaly  HEENT:  Normocephalic . Face symmetric, atraumatic Lungs:  CTA B Normal respiratory effort, no intercostal retractions, no accessory muscle use. Heart: RRR,  no murmur.  No pretibial edema bilaterally  Abdomen:  Not distended, soft, non-tender. No rebound or rigidity.   Skin: See graphic.  Facial patchy erythema consistent with rosacea. Neurologic:  alert & oriented X3.  Speech normal, gait appropriate for age and unassisted Strength symmetric and appropriate for age.  Psych: Cognition and judgment appear intact.  Cooperative with normal attention span and concentration.  Behavior appropriate. No anxious or depressed appearing.     Assessment     Assessment Elevated BP x 1-2 times 2016 Blood donor (q 3 months) Mild thrombocytopenia Allergic rhinitis Rosacea: face Eczema (elbows)  PLAN: Here for CPX Mild thrombocytopenia: No other CBC  abnormalities, recheck yearly. Rosacea: Declines treatment, will call when needed. Eczema: Rash at the elbow consistent with eczema, will refill a topical steroid to use as needed RTC 1 year

## 2018-06-17 NOTE — Progress Notes (Signed)
Pre visit review using our clinic review tool, if applicable. No additional management support is needed unless otherwise documented below in the visit note. 

## 2018-06-17 NOTE — Assessment & Plan Note (Addendum)
-  Tdap 2013; had a flu shot  -CCS: no FH, never had a Cscope - prostrate ca screening: not indicated  -Labs CMP, lipids, CBC and TSH.  No fasting  -Diet and exercise discussed

## 2018-06-17 NOTE — Patient Instructions (Signed)
GO TO THE LAB : Get the blood work     GO TO THE FRONT DESK Schedule your next appointment  For a physical in 1 year, fasting    

## 2018-06-18 NOTE — Assessment & Plan Note (Signed)
Here for CPX Mild thrombocytopenia: No other CBC abnormalities, recheck yearly. Rosacea: Declines treatment, will call when needed. Eczema: Rash at the elbow consistent with eczema, will refill a topical steroid to use as needed RTC 1 year

## 2018-08-02 ENCOUNTER — Telehealth: Payer: BC Managed Care – PPO | Admitting: Family

## 2018-08-02 DIAGNOSIS — J302 Other seasonal allergic rhinitis: Secondary | ICD-10-CM | POA: Diagnosis not present

## 2018-08-02 MED ORDER — ALBUTEROL SULFATE HFA 108 (90 BASE) MCG/ACT IN AERS: 2.0000 | INHALATION_SPRAY | Freq: Four times a day (QID) | RESPIRATORY_TRACT | 2 refills | Status: DC | PRN

## 2018-08-02 NOTE — Progress Notes (Signed)
E visit for Allergic Rhinitis We are sorry that you are not feeling well.  Here is how we plan to help!  Based on what you have shared with me it looks like you have Allergic Rhinitis.  Rhinitis is when a reaction occurs that causes nasal congestion, runny nose, sneezing, and itching.  Most types of rhinitis are caused by an inflammation and are associated with symptoms in the eyes ears or throat. There are several types of rhinitis.  The most common are acute rhinitis, which is usually caused by a viral illness, allergic or seasonal rhinitis, and nonallergic or year-round rhinitis.  Nasal allergies occur certain times of the year.  Allergic rhinitis is caused when allergens in the air trigger the release of histamine in the body.  Histamine causes itching, swelling, and fluid to build up in the fragile linings of the nasal passages, sinuses and eyelids.  An itchy nose and clear discharge are common.  I recommend the following over the counter treatments: You should take a daily dose of antihistamine and Xyzal 5 mg take 1 tablet daily  I also would recommend an inhaler: Albuterol 2 puffs every 6 hours as needed   HOME CARE:   You can use an over-the-counter saline nasal spray as needed  Avoid areas where there is heavy dust, mites, or molds  Stay indoors on windy days during the pollen season  Keep windows closed in home, at least in bedroom; use air conditioner.  Use high-efficiency house air filter  Keep windows closed in car, turn AC on re-circulate  Avoid playing out with dog during pollen season  GET HELP RIGHT AWAY IF:   If your symptoms do not improve within 10 days  You become short of breath  You develop yellow or green discharge from your nose for over 3 days  You have coughing fits  MAKE SURE YOU:   Understand these instructions  Will watch your condition  Will get help right away if you are not doing well or get worse  Thank you for choosing an  e-visit. Your e-visit answers were reviewed by a board certified advanced clinical practitioner to complete your personal care plan. Depending upon the condition, your plan could have included both over the counter or prescription medications. Please review your pharmacy choice. Be sure that the pharmacy you have chosen is open so that you can pick up your prescription now.  If there is a problem you may message your provider in MyChart to have the prescription routed to another pharmacy. Your safety is important to [place]. If you have drug allergies check your prescription carefully.  For the next 24 hours, you can use MyChart to ask questions about today's visit, request a non-urgent call back, or ask for a work or school excuse from your e-visit provider. You will get an email in the next two days asking about your experience. I hope that your e-visit has been valuable and will speed your recovery.

## 2019-02-04 ENCOUNTER — Encounter: Payer: Self-pay | Admitting: Internal Medicine

## 2019-02-04 ENCOUNTER — Ambulatory Visit (HOSPITAL_BASED_OUTPATIENT_CLINIC_OR_DEPARTMENT_OTHER)
Admission: RE | Admit: 2019-02-04 | Discharge: 2019-02-04 | Disposition: A | Discharge status: Home / Self Care | Payer: BC Managed Care – PPO | Source: Ambulatory Visit | Attending: Internal Medicine | Admitting: Internal Medicine

## 2019-02-04 ENCOUNTER — Other Ambulatory Visit: Payer: Self-pay

## 2019-02-04 ENCOUNTER — Ambulatory Visit: Payer: BC Managed Care – PPO | Admitting: Internal Medicine

## 2019-02-04 VITALS — BP 120/90 | HR 62 | Temp 98.1°F | Resp 16 | Ht 73.0 in | Wt 232.4 lb

## 2019-02-04 DIAGNOSIS — Z23 Encounter for immunization: Secondary | ICD-10-CM | POA: Diagnosis not present

## 2019-02-04 DIAGNOSIS — R030 Elevated blood-pressure reading, without diagnosis of hypertension: Secondary | ICD-10-CM

## 2019-02-04 DIAGNOSIS — W19XXXA Unspecified fall, initial encounter: Secondary | ICD-10-CM

## 2019-02-04 DIAGNOSIS — S5002XA Contusion of left elbow, initial encounter: Secondary | ICD-10-CM

## 2019-02-04 DIAGNOSIS — M25522 Pain in left elbow: Secondary | ICD-10-CM | POA: Insufficient documentation

## 2019-02-04 DIAGNOSIS — S52125A Nondisplaced fracture of head of left radius, initial encounter for closed fracture: Secondary | ICD-10-CM | POA: Diagnosis not present

## 2019-02-04 DIAGNOSIS — S42402A Unspecified fracture of lower end of left humerus, initial encounter for closed fracture: Secondary | ICD-10-CM | POA: Diagnosis not present

## 2019-02-04 NOTE — Progress Notes (Signed)
Pre visit review using our clinic review tool, if applicable. No additional management support is needed unless otherwise documented below in the visit note. 

## 2019-02-04 NOTE — Patient Instructions (Addendum)
   STOP BY THE FIRST FLOOR:  get the XR     Check the  blood pressure   Weekly  BP GOAL is between 110/65 and  135/85. If it is consistently higher or lower, let me know

## 2019-02-04 NOTE — Progress Notes (Signed)
Subjective:    Patient ID: Shawn Webb, male    DOB: 1975/08/21, 43 y.o.   MRN: [id number]  DOS:  02/04/2019 Type of visit - description: Acute 2 days ago he was playing tennis, fell, landed on his left elbow. He kept playing. The next day the area was sore and stiff. Today continue to be stiff, slightly less sore but he has noticed that is unable to fully extend the elbow. Also, ambulatory BPs are slightly elevated in the 150s over 90s.  BP Readings from Last 3 Encounters:  02/04/19 120/90  06/17/18 124/68  06/08/17 128/76     Review of Systems Denies any other injuries during the recent fall Admits that due to a change in his job he is more sedentary now. Does well with a low-salt diet  Past Medical History:  Diagnosis Date  . Allergy   . Chicken pox   . Elevated BP     Past Surgical History:  Procedure Laterality Date  . NO PAST SURGERIES      Social History   Socioeconomic History  . Marital status: Married    Spouse name: Not on file  . Number of children: 2  . Years of education: Not on file  . Highest education level: Not on file  Occupational History  . Occupation: Pharmacist, hospital, [employer], [place]  . Financial resource strain: Not on file  . Food insecurity    Worry: Not on file    Inability: Not on file  . Transportation needs    Medical: Not on file    Non-medical: Not on file  Tobacco Use  . Smoking status: Never Smoker  . Smokeless tobacco: Never Used  Substance and Sexual Activity  . Alcohol use: Yes    Comment: socially   . Drug use: No  . Sexual activity: Not on file  Lifestyle  . Physical activity    Days per week: Not on file    Minutes per session: Not on file  . Stress: Not on file  Relationships  . Social Herbalist on phone: Not on file    Gets together: Not on file    Attends religious service: Not on file    Active member of club or organization: Not on file    Attends meetings of  clubs or organizations: Not on file    Relationship status: Not on file  . Intimate partner violence    Fear of current or ex partner: Not on file    Emotionally abused: Not on file    Physically abused: Not on file    Forced sexual activity: Not on file  Other Topics Concern  . Not on file  Social History Narrative   Children: 2011, 2016    Plays outdoor tennis, hiker.   Used to teach at high school, now teaches technology to adults [employer]             Allergies as of 02/04/2019      Reactions   Sulfa Antibiotics Other (See Comments)   Unknown      Medication List       Accurate as of February 04, 2019 11:59 PM. If you have any questions, ask your nurse or doctor.        albuterol 108 (90 Base) MCG/ACT inhaler Commonly known as: VENTOLIN HFA Inhale 2 puffs into the lungs every 6 (six) hours as needed for wheezing or shortness of breath.  augmented betamethasone dipropionate 0.05 % cream Commonly known as: DIPROLENE-AF Apply topically 2 (two) times daily.   cetirizine 5 MG tablet Commonly known as: ZYRTEC Take 5 mg by mouth daily.           Objective:   Physical Exam BP 120/90 (BP Location: Right Arm)   Pulse 62   Temp 98.1 F (36.7 C) (Temporal)   Resp 16   Ht 6\' 1"  (1.854 m)   Wt 232 lb 6 oz (105.4 kg)   SpO2 99%   BMI 30.66 kg/m   General:   Well developed, NAD, BMI noted. HEENT:  Normocephalic . Face symmetric, atraumatic MSK: Right elbow normal Left elbow: No deformities, epicondyles and olecranon no TTP.  Very mild swelling (bursa) Range of motion: Minimal decrease extension. He did report some very elbow pain when we rotate his forearm  Skin: Not pale. Not jaundice Neurologic:  alert & oriented X3.  Speech normal, gait appropriate for age and unassisted Psych--  Cognition and judgment appear intact.  Cooperative with normal attention span and concentration.  Behavior appropriate. No anxious or depressed appearing.      Assessment      Assessment Elevated BP x 1-2 times 2016 Blood donor (q 3 months) Mild thrombocytopenia Allergic rhinitis Rosacea: face Eczema (elbows)  PLAN: Left elbow injury: Suspect contusion, mild bursitis  Overall pain has decreased, minimal decrease in range of motion.  We will get an x-ray. Ice, ibuprofen, if not better in few days he will let me know Addendum : X-ray show a fracture, Ortho referral Elevated BP: BP at home sometimes in the 150/90. BP today was elevated, I rechecked it: 120/90. Plan: Increase physical activity, continue with a low-salt diet, monitor BPs, see AVS. RTC already scheduled for 05/2019

## 2019-02-06 NOTE — Assessment & Plan Note (Signed)
  Left elbow injury: Suspect contusion, mild bursitis  Overall pain has decreased, minimal decrease in range of motion.  We will get an x-ray. Ice, ibuprofen, if not better in few days he will let me know Addendum : X-ray show a fracture, Ortho referral Elevated BP: BP at home sometimes in the 150/90. BP today was elevated, I rechecked it: 120/90. Plan: Increase physical activity, continue with a low-salt diet, monitor BPs, see AVS. RTC already scheduled for 05/2019

## 2019-02-07 ENCOUNTER — Other Ambulatory Visit: Payer: Self-pay

## 2019-02-07 ENCOUNTER — Encounter: Payer: Self-pay | Admitting: Orthopaedic Surgery

## 2019-02-07 ENCOUNTER — Ambulatory Visit (INDEPENDENT_AMBULATORY_CARE_PROVIDER_SITE_OTHER): Payer: BC Managed Care – PPO | Admitting: Orthopaedic Surgery

## 2019-02-07 DIAGNOSIS — S52125A Nondisplaced fracture of head of left radius, initial encounter for closed fracture: Secondary | ICD-10-CM | POA: Diagnosis not present

## 2019-02-07 NOTE — Addendum Note (Signed)
Addended byDamita Dunnings D on: 02/07/2019 08:06 AM   Modules accepted: Orders

## 2019-02-07 NOTE — Progress Notes (Signed)
Office Visit Note   Patient: Shawn Webb           Date of Birth: 1975-10-08           MRN: [id number] Visit Date: 02/07/2019              Requested by: Wanda Plump, MD [address] [place],  [place] [address] PCP: Wanda Plump, MD   Assessment & Plan: Visit Diagnoses:  1. Closed nondisplaced fracture of head of left radius, initial encounter     Plan: He is been to be very careful with his elbow and avoid tennis.  He will avoid any high impact aerobic activities.  He should not lift anything greater than 5 to 10 pounds without left arm.  I would like to see him back next week for an AP and lateral of the left elbow.  All questions were answered and addressed.  Follow-Up Instructions: Return in about 1 week (around 02/14/2019).   Orders:  No orders of the defined types were placed in this encounter.  No orders of the defined types were placed in this encounter.     Procedures: No procedures performed   Clinical Data: No additional findings.   Subjective: Chief Complaint  Patient presents with  . Left Elbow - Pain, Injury  The patient comes in today with an acute left elbow injury that happened a week ago while he was playing tennis.  This is a Wednesday of last weeks usually 5 days ago.  He fell on outstretched left dominant hand.  X-rays were obtained his primary care physician he was found to have a radial head fracture.  He was given follow-up in our office today.  He said his pain is decreased and he is getting better with his range of motion as well.  He denies any numbness and tingling in his left hand.  He denies any left shoulder pain.  He has not injured this elbow before.  He is an avid Administrator.  He is not a diabetic and not a smoker  HPI  Review of Systems He currently denies any headache, chest pain, shortness of breath, fever, chills, nausea, vomiting  Objective: Vital Signs: There were no vitals taken for this visit.   Physical Exam He is alert and orient x3 and in no acute distress Ortho Exam Examination of his left elbow shows the elbow effusion.  There is significant bruising around his left elbow.  He is clinically well located.  His left hand exam is normal.  He does have some limited flexion extension of the elbow on the left side but it feels ligamentously stable. Specialty Comments:  No specialty comments available.  Imaging: No results found. Several views of the left elbow from 5 days ago show a minimally displaced left elbow radial head fracture.   PMFS History: Patient Active Problem List   Diagnosis Date Noted  . PCP NOTES >>>> 03/05/2015  . Annual physical exam 11/21/2011  . Allergic rhinitis 11/21/2011   Past Medical History:  Diagnosis Date  . Allergy   . Chicken pox   . Elevated BP     Family History  Problem Relation Age of Onset  . Colon cancer Maternal Grandfather 3  . CAD Neg Hx   . Diabetes Neg Hx   . Prostate cancer Neg Hx     Past Surgical History:  Procedure Laterality Date  . NO PAST SURGERIES  Social History   Occupational History  . Occupation: Pharmacist, hospital, [employer], [place]  Tobacco Use  . Smoking status: Never Smoker  . Smokeless tobacco: Never Used  Substance and Sexual Activity  . Alcohol use: Yes    Comment: socially   . Drug use: No  . Sexual activity: Not on file

## 2019-02-14 ENCOUNTER — Ambulatory Visit (INDEPENDENT_AMBULATORY_CARE_PROVIDER_SITE_OTHER): Payer: BC Managed Care – PPO | Admitting: Orthopaedic Surgery

## 2019-02-14 ENCOUNTER — Ambulatory Visit (INDEPENDENT_AMBULATORY_CARE_PROVIDER_SITE_OTHER): Payer: BC Managed Care – PPO

## 2019-02-14 ENCOUNTER — Encounter: Payer: Self-pay | Admitting: Orthopaedic Surgery

## 2019-02-14 ENCOUNTER — Other Ambulatory Visit: Payer: Self-pay

## 2019-02-14 DIAGNOSIS — S52125D Nondisplaced fracture of head of left radius, subsequent encounter for closed fracture with routine healing: Secondary | ICD-10-CM | POA: Diagnosis not present

## 2019-02-14 NOTE — Progress Notes (Signed)
The patient is a very pleasant 43 year old gentleman who is following up after having sustained a minimally displaced to nondisplaced radial head fracture on his left elbow.  This was seen on oblique views only.  He says he is doing much better overall.  He is only a week or 2 until his injury.  He had significant decreased motion after the injury but that has improved.  On examination of his left elbow it is clinically well located.  His flexion is full but he lacks full extension by only about 3 to 5 degrees.  This is much improved from his last visit.  Surprisingly her significant bruising on the medial aspect of his elbow.  His elbow is ligamentously stable on exam and there is no pain on the radial head with rotation.  His biceps tendon distally is intact.  He does have pain with dorsiflexion of the left wrist but the wrist is stable through pronation and supination and I cannot subluxate the wrist or the DRUJ.  2 views of the left elbow are obtained and there are still slight elbow effusion but the fracture is not seen on the AP and lateral views.  There are no otherwise worrisome features of his left elbow.  This point he knows to gradually get back into sports as he is comfortable but to really take it easy for at least another month.  He will work on range of motion of his elbow.  I think that he will probably get full terminal extension soon based on what his clinical exam looks like today.  We can always set him up for therapy if he needs it but I doubt he does.  All question concerns were answered and addressed.  Follow-up can be as needed.

## 2019-06-17 ENCOUNTER — Other Ambulatory Visit: Payer: Self-pay

## 2019-06-20 ENCOUNTER — Ambulatory Visit: Payer: BC Managed Care – PPO | Admitting: Internal Medicine

## 2019-06-23 ENCOUNTER — Encounter: Payer: BC Managed Care – PPO | Admitting: Internal Medicine

## 2019-06-28 ENCOUNTER — Ambulatory Visit: Payer: BC Managed Care – PPO | Attending: Internal Medicine

## 2019-06-28 DIAGNOSIS — Z23 Encounter for immunization: Secondary | ICD-10-CM | POA: Insufficient documentation

## 2019-07-05 ENCOUNTER — Encounter: Payer: Self-pay | Admitting: Internal Medicine

## 2019-07-05 ENCOUNTER — Ambulatory Visit: Payer: BC Managed Care – PPO | Admitting: Internal Medicine

## 2019-07-05 ENCOUNTER — Other Ambulatory Visit: Payer: Self-pay

## 2019-07-05 VITALS — BP 144/89 | HR 69 | Temp 96.2°F | Resp 16 | Ht 73.0 in | Wt 224.2 lb

## 2019-07-05 DIAGNOSIS — M5440 Lumbago with sciatica, unspecified side: Secondary | ICD-10-CM | POA: Diagnosis not present

## 2019-07-05 MED ORDER — DICLOFENAC SODIUM 50 MG PO TBEC: 50.0000 mg | DELAYED_RELEASE_TABLET | Freq: Three times a day (TID) | ORAL | 0 refills | Status: DC | PRN

## 2019-07-05 MED ORDER — PANTOPRAZOLE SODIUM 40 MG PO TBEC: 40.0000 mg | DELAYED_RELEASE_TABLET | Freq: Every day | ORAL | 0 refills | Status: DC

## 2019-07-05 MED ORDER — CYCLOBENZAPRINE HCL 10 MG PO TABS: 10.0000 mg | ORAL_TABLET | Freq: Every evening | ORAL | 0 refills | Status: DC | PRN

## 2019-07-05 NOTE — Progress Notes (Signed)
Pre visit review using our clinic review tool, if applicable. No additional management support is needed unless otherwise documented below in the visit note. 

## 2019-07-05 NOTE — Progress Notes (Signed)
Subjective:    Patient ID: Shawn Webb, male    DOB: [birth year], 44 y.o.   MRN: [id number]  DOS:  07/05/2019 Type of visit - description: Acute Symptoms started 4-week ago: Pain at the left buttock that radiates to the posterior aspect of the leg, up to the calf. The pain can be mild to severe, worse after he is sitting and starts walking. Went to urgent care, was Rx prednisone, make him feel "weird".  Did not help much.  He is taking Advil every 6 hours with partial help.  Doing some stretching but that aggravates the pain sometimes.  Review of Systems Denies any fever chills No rash at the buttocks, no injury. No numbness or weakness.  Past Medical History:  Diagnosis Date  . Allergy   . Chicken pox   . Elevated BP     Past Surgical History:  Procedure Laterality Date  . NO PAST SURGERIES      Allergies as of 07/05/2019      Reactions   Sulfa Antibiotics Other (See Comments)   Unknown      Medication List       Accurate as of July 05, 2019  3:51 PM. If you have any questions, ask your nurse or doctor.        albuterol 108 (90 Base) MCG/ACT inhaler Commonly known as: VENTOLIN HFA Inhale 2 puffs into the lungs every 6 (six) hours as needed for wheezing or shortness of breath.   augmented betamethasone dipropionate 0.05 % cream Commonly known as: DIPROLENE-AF Apply topically 2 (two) times daily.   cetirizine 5 MG tablet Commonly known as: ZYRTEC Take 5 mg by mouth daily.   ibuprofen 200 MG tablet Commonly known as: ADVIL Take 200 mg by mouth every 6 (six) hours as needed.          Objective:   Physical Exam BP (!) 144/89 (BP Location: Left Arm, Patient Position: Sitting, Cuff Size: Normal)   Pulse 69   Temp (!) 96.2 F (35.7 C) (Temporal)   Resp 16   Ht 6\' 1"  (1.854 m)   Wt 224 lb 4 oz (101.7 kg)   SpO2 98%   BMI 29.59 kg/m  General:   Well developed, NAD, BMI noted. HEENT:  Normocephalic . Face symmetric, atraumatic delete MSK:  Slightly TTP at the left sacroiliac area.  Not tender at the lumbosacral spine Lower extremities: no pretibial edema bilaterally  Skin: Not pale. Not jaundice Neurologic:  alert & oriented X3.  Speech normal, gait appropriate for age and unassisted Motor and DTRs symmetric Straight leg test mildly positive on the left Psych--  Cognition and judgment appear intact.  Cooperative with normal attention span and concentration.  Behavior appropriate. No anxious or depressed appearing.      Assessment     Assessment Elevated BP x 1-2 times 2016 Blood donor (q 3 months) Mild thrombocytopenia Allergic rhinitis Rosacea: face Eczema (elbows)  PLAN: Acute back pain, possible S1 radiculopathy. He already had prednisone, did not help much and make him feel weird. We talk about early Ortho referral versus continue conservative treatment.  We elected to do the following: Rest, heat/cold compresses. Diclofenac with GI precautions and PPIs.  See AVS Flexeril at bedtime watch for drowsiness Call in 2 weeks if not improving for a referral Call anytime if severe symptoms, focal weaknesses or paresthesias.  This visit occurred during the SARS-CoV-2 public health emergency.  Safety protocols were in place, including screening questions prior to the  visit, additional usage of staff PPE, and extensive cleaning of exam room while observing appropriate contact time as indicated for disinfecting solutions.

## 2019-07-05 NOTE — Patient Instructions (Addendum)
Rest  Try cold or heating pad  Flexeril, a muscle relaxant at bedtime  Diclofenac is an anti-inflammatory, take every 8 to 12 hours for the first 7 days.  Then as needed only Stop over-the-counter anti-inflammatory medicines  Always take it with food because may cause gastritis and ulcers.  If you notice nausea, stomach pain, change in the color of stools --->  Stop the medicine and let us know  Also take pantoprazole 1 tablet daily before breakfast, acid reducer to protect your stomach  If you are no better call for a Ortho referral  Once you are better, okay to do physical therapy  Call anytime if symptoms increase or if you have weakness in your leg

## 2019-07-06 NOTE — Assessment & Plan Note (Signed)
Acute back pain, possible S1 radiculopathy. He already had prednisone, did not help much and make him feel weird. We talk about early Ortho referral versus continue conservative treatment.  We elected to do the following: Rest, heat/cold compresses. Diclofenac with GI precautions and PPIs.  See AVS Flexeril at bedtime watch for drowsiness Call in 2 weeks if not improving for a referral Call anytime if severe symptoms, focal weaknesses or paresthesias.

## 2019-07-11 ENCOUNTER — Telehealth: Payer: Self-pay | Admitting: Internal Medicine

## 2019-07-11 NOTE — Telephone Encounter (Signed)
Patient called in to state, back pain has worsen since visit with Dr Drue Novel. Patient asked to call back and speak Dr Drue Novel if symptoms worsened.   Please Advise

## 2019-07-11 NOTE — Telephone Encounter (Signed)
Please advise 

## 2019-07-11 NOTE — Telephone Encounter (Signed)
Spoke with the patient, was recently seen with left S1 radiculopathy pain is much worse, has a hard time walking. No bladder or bowel incontinence. He is not completely sure but apparently he has some left leg weakness. Recommend: Go now to the orthopedic urgent care, [facility], address and phone number provided. If unable to go, recommend ER tonight.

## 2019-07-12 ENCOUNTER — Encounter: Payer: Self-pay | Admitting: Internal Medicine

## 2019-07-17 ENCOUNTER — Other Ambulatory Visit: Payer: Self-pay | Admitting: Internal Medicine

## 2019-07-18 MED ORDER — PANTOPRAZOLE SODIUM 40 MG PO TBEC: 40.0000 mg | DELAYED_RELEASE_TABLET | Freq: Every day | ORAL | 3 refills | Status: DC

## 2019-07-18 MED ORDER — DICLOFENAC SODIUM 50 MG PO TBEC: 50.0000 mg | DELAYED_RELEASE_TABLET | Freq: Three times a day (TID) | ORAL | 0 refills | Status: DC | PRN

## 2019-07-18 MED ORDER — CYCLOBENZAPRINE HCL 10 MG PO TABS: 10.0000 mg | ORAL_TABLET | Freq: Every evening | ORAL | 0 refills | Status: DC | PRN

## 2019-07-19 ENCOUNTER — Telehealth: Payer: Self-pay | Admitting: Internal Medicine

## 2019-07-19 NOTE — Telephone Encounter (Signed)
Patient is requesting a call back due to him getting into seeing a specialist . Patient states the specialist he was referred to has referred him to another specialist , patient is having trouble getting in with that doctor. Patient states that he needs MRI.   Please advise

## 2019-07-20 NOTE — Telephone Encounter (Signed)
Spoke w/ Pt- he saw Dr. Chip Boer at [facility] last week- was recommended an MRI- he had not received call from them regarding being set up originally. He informed though this morning he received a call from them and MRI scheduled for Saturday March 27. Will attempt to get records.

## 2019-07-20 NOTE — Telephone Encounter (Addendum)
See note from 07/12/2019.  Reportedly he saw another doctor, please get records.

## 2019-07-21 ENCOUNTER — Ambulatory Visit: Payer: BC Managed Care – PPO | Admitting: Family Medicine

## 2019-07-26 ENCOUNTER — Ambulatory Visit: Payer: BC Managed Care – PPO | Attending: Internal Medicine

## 2019-07-26 DIAGNOSIS — Z23 Encounter for immunization: Secondary | ICD-10-CM

## 2019-07-26 NOTE — Progress Notes (Signed)
   Covid-19 Vaccination Clinic  Name:  Shawn Webb    MRN: [id number] DOB: [birth year]  07/26/2019  Shawn Webb was observed post Covid-19 immunization for 15 minutes without incident. He was provided with Vaccine Information Sheet and instruction to access the V-Safe system.   Shawn Webb was instructed to call 911 with any severe reactions post vaccine: [name] Kitchen Difficulty breathing  . Swelling of face and throat  . A fast heartbeat  . A bad rash all over body  . Dizziness and weakness   Immunizations Administered    Name Date Dose VIS Date Route   Moderna COVID-19 Vaccine 07/26/2019  9:29 AM 0.5 mL 03/29/2019 Intramuscular   Manufacturer: Moderna   Lot: [id number]   NDC: 15400-867-61

## 2019-07-28 ENCOUNTER — Other Ambulatory Visit: Payer: Self-pay | Admitting: Orthopaedic Surgery

## 2019-07-28 DIAGNOSIS — M545 Low back pain, unspecified: Secondary | ICD-10-CM

## 2019-08-11 ENCOUNTER — Other Ambulatory Visit: Payer: Self-pay

## 2019-08-12 ENCOUNTER — Encounter: Payer: Self-pay | Admitting: Medical

## 2019-08-12 ENCOUNTER — Other Ambulatory Visit: Payer: Self-pay

## 2019-08-12 ENCOUNTER — Ambulatory Visit: Payer: BC Managed Care – PPO | Admitting: Medical

## 2019-08-12 VITALS — BP 150/90 | HR 63 | Temp 97.0°F | Resp 18 | Ht 73.0 in | Wt 221.6 lb

## 2019-08-12 DIAGNOSIS — R42 Dizziness and giddiness: Secondary | ICD-10-CM | POA: Diagnosis not present

## 2019-08-12 DIAGNOSIS — E785 Hyperlipidemia, unspecified: Secondary | ICD-10-CM

## 2019-08-12 DIAGNOSIS — R0789 Other chest pain: Secondary | ICD-10-CM

## 2019-08-12 DIAGNOSIS — R030 Elevated blood-pressure reading, without diagnosis of hypertension: Secondary | ICD-10-CM

## 2019-08-12 DIAGNOSIS — M5440 Lumbago with sciatica, unspecified side: Secondary | ICD-10-CM | POA: Diagnosis not present

## 2019-08-12 DIAGNOSIS — M5410 Radiculopathy, site unspecified: Secondary | ICD-10-CM

## 2019-08-12 LAB — CBC WITH DIFFERENTIAL/PLATELET
Basophils Absolute: 0 10*3/uL (ref 0.0–0.1)
Basophils Relative: 0.5 % (ref 0.0–3.0)
Eosinophils Absolute: 0.2 10*3/uL (ref 0.0–0.7)
Eosinophils Relative: 4.3 % (ref 0.0–5.0)
HCT: 41.5 % (ref 39.0–52.0)
Hemoglobin: 14.1 g/dL (ref 13.0–17.0)
Lymphocytes Relative: 28.4 % (ref 12.0–46.0)
Lymphs Abs: 1.4 10*3/uL (ref 0.7–4.0)
MCHC: 33.9 g/dL (ref 30.0–36.0)
MCV: 88.6 fl (ref 78.0–100.0)
Monocytes Absolute: 0.4 10*3/uL (ref 0.1–1.0)
Monocytes Relative: 7.5 % (ref 3.0–12.0)
Neutro Abs: 2.9 10*3/uL (ref 1.4–7.7)
Neutrophils Relative %: 59.3 % (ref 43.0–77.0)
Platelets: 128 10*3/uL — ABNORMAL LOW (ref 150.0–400.0)
RBC: 4.69 Mil/uL (ref 4.22–5.81)
RDW: 13.4 % (ref 11.5–15.5)
WBC: 4.9 10*3/uL (ref 4.0–10.5)

## 2019-08-12 LAB — COMPREHENSIVE METABOLIC PANEL
ALT: 35 U/L (ref 0–53)
AST: 20 U/L (ref 0–37)
Albumin: 4.4 g/dL (ref 3.5–5.2)
Alkaline Phosphatase: 93 U/L (ref 39–117)
BUN: 14 mg/dL (ref 6–23)
CO2: 29 mEq/L (ref 19–32)
Calcium: 9.2 mg/dL (ref 8.4–10.5)
Chloride: 105 mEq/L (ref 96–112)
Creatinine, Ser: 1.04 mg/dL (ref 0.40–1.50)
GFR: 78.31 mL/min (ref >60.00)
Glucose, Bld: 99 mg/dL (ref 70–99)
Potassium: 4.2 mEq/L (ref 3.5–5.1)
Sodium: 140 mEq/L (ref 135–145)
Total Bilirubin: 0.5 mg/dL (ref 0.2–1.2)
Total Protein: 6.8 g/dL (ref 6.0–8.3)

## 2019-08-12 LAB — TROPONIN I (HIGH SENSITIVITY): High Sens Troponin I: 5 ng/L (ref 2–17)

## 2019-08-12 MED ORDER — GABAPENTIN 100 MG PO CAPS: ORAL_CAPSULE | ORAL | 0 refills | Status: DC

## 2019-08-12 MED ORDER — LOSARTAN POTASSIUM 25 MG PO TABS: 25.0000 mg | ORAL_TABLET | Freq: Every day | ORAL | 0 refills | Status: DC

## 2019-08-12 NOTE — Patient Instructions (Signed)
Moderate blood pressure elevation today.  History of BP elevation in the past but no formal diagnosis of hypertension yet.  We will go ahead and prescribe losartan 25 mg dose to take daily.  I think this is best in light of the recent intermittent dizziness and your atypical chest pain.  Your EKG today mild bradycardia but you are resting for some time before I got into the room and an EKG was repeated.  You have some scattered artifact present.  Artifact appear to obscure interpretation T wave.  This is despite repeating the EKG.  No current pain but did have some atypical pain associated with left leg pain yesterday at 1 PM.  I am going to order stat troponin level today.  If he has any recurrent chest pain as we pursue we can have to advise ED evaluation.  I am going to go ahead and refer you to cardiology as well and hopefully they will get you in early next week.  You have intermittent transient dizziness with normal neurologic exam presently.  You have any gross motor or sensory neurologic type symptoms then recommend ED evaluation.  For your history of sciatica and radicular pain, I want you to titrate up on your gabapentin.  That would be 100 mg twice a day for 1 next week then 3 times a day the following week.  On review you have no worrisome signs of DVT.  However it is you were to get any calf swelling or pain behind your knee then be seen in the emergency department.  Follow-up in 1 week with PCP or myself.  As needed as well.

## 2019-08-12 NOTE — Progress Notes (Signed)
Subjective:    Patient ID: Shawn Webb, male    DOB: 07/06/1975, 44 y.o.   MRN: [id number]  HPI  Pt in with some high bp. His bp has been high in the past periodically but not on bp medication.Pt has not been on diclofenac for 2 weeks. He is on gabapentin. He is on flexeril as well.  Pt has hx of sciatica in the past. He has seen specialist but pain not resolving. Pt tried mri and had panic attack so that was dc'd 3 weeks ago. He has open mri scheduled upcoming week.  Pt was moderate to severe. Pain decreased with gabapentin some and rest.  Pt states about one week ago he had dizziness. Dizziness has been at rest. Not presently. Has subside today. No gross motor or sensory function deficits associated. No ha and no vertigo described.  Pt states one week ago. He had chest pain which he associated with his leg pain. Pt states chest pain been transient. When he walks will get chest pain. He states very minimal when has and brief. No jaw pain, no arm pain, feels at time maybe sob with chest pain but states with mask sometimes will feel sob(Then states really has no sob). No sweating events when he had chest pain. He associates pain with leg pain. No diabetes, non smoker. Mild ldl elevated in past. No fh of heart attack or strokes.   He mentions last time had chest pain that was present 1 pm yesterday.  This morning he had 3 large cups of coffee.   Pt notes whenever he donates blood his bp is always high.       Review of Systems  Constitutional: Negative for chills, diaphoresis, fatigue and fever.  Respiratory: Negative for cough, chest tightness, shortness of breath and wheezing.        See hpi. No current symptoms.  Cardiovascular: Negative for chest pain and palpitations.  Gastrointestinal: Negative for abdominal pain.  Musculoskeletal: Positive for back pain.       See hpi.  Skin: Negative for rash.  Neurological: Positive for dizziness. Negative for tremors, syncope, speech  difficulty, weakness and headaches.       But not presently. See hpi.  Hematological: Negative for adenopathy. Does not bruise/bleed easily.  Psychiatric/Behavioral: Negative for confusion and hallucinations. The patient is not nervous/anxious.        But does state stress about health.    Past Medical History:  Diagnosis Date  . Allergy   . Chicken pox   . Elevated BP      Social History   Socioeconomic History  . Marital status: Married    Spouse name: Not on file  . Number of children: 2  . Years of education: Not on file  . Highest education level: Not on file  Occupational History  . Occupation: Runner, broadcasting/film/video, [employer], [employer]  Tobacco Use  . Smoking status: Never Smoker  . Smokeless tobacco: Never Used  Substance and Sexual Activity  . Alcohol use: Yes    Comment: socially   . Drug use: No  . Sexual activity: Not on file  Other Topics Concern  . Not on file  Social History Narrative   Children: 2011, 2016    Plays outdoor tennis, hiker.   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 Difficulty  of Paying Living Expenses:   Food Insecurity:   . Worried About Charity fundraiser in the Last Year:   . Arboriculturist in the Last Year:   Transportation Needs:   . Film/video editor (Medical):   [name] Kitchen Lack of Transportation (Non-Medical):   Physical Activity:   . Days of Exercise per Week:   . Minutes of Exercise per Session:   Stress:   . Feeling of Stress :   Social Connections:   . Frequency of Communication with Friends and Family:   . Frequency of Social Gatherings with Friends and Family:   . Attends Religious Services:   . Active Member of Clubs or Organizations:   . Attends Archivist Meetings:   [name] Kitchen Marital Status:   Intimate Partner Violence:   . Fear of Current or Ex-Partner:   . Emotionally Abused:   [name] Kitchen Physically Abused:     . Sexually Abused:     Past Surgical History:  Procedure Laterality Date  . NO PAST SURGERIES      Family History  Problem Relation Age of Onset  . Colon cancer Maternal Grandfather 76  . CAD Neg Hx   . Diabetes Neg Hx   . Prostate cancer Neg Hx     Allergies  Allergen Reactions  . Sulfa Antibiotics Other (See Comments)    Unknown    Current Outpatient Medications on File Prior to Visit  Medication Sig Dispense Refill  . albuterol (PROVENTIL HFA;VENTOLIN HFA) 108 (90 Base) MCG/ACT inhaler Inhale 2 puffs into the lungs every 6 (six) hours as needed for wheezing or shortness of breath. 1 Inhaler 2  . cetirizine (ZYRTEC) 5 MG tablet Take 5 mg by mouth daily.    . cyclobenzaprine (FLEXERIL) 10 MG tablet Take 1 tablet (10 mg total) by mouth at bedtime as needed for muscle spasms. 21 tablet 0  . diclofenac (VOLTAREN) 50 MG EC tablet Take 1 tablet (50 mg total) by mouth 3 (three) times daily as needed. 30 tablet 0  . pantoprazole (PROTONIX) 40 MG tablet Take 1 tablet (40 mg total) by mouth daily before breakfast. 90 tablet 3   No current facility-administered medications on file prior to visit.    BP (!) 150/90   Pulse 63   Temp (!) 97 F (36.1 C) (Temporal)   Resp 18   Ht 6\' 1"  (1.854 m)   Wt 221 lb 9.6 oz (100.5 kg)   SpO2 100%   BMI 29.24 kg/m       Objective:   Physical Exam   General Mental Status- Alert. General Appearance- Not in acute distress.   Skin General: Color- Normal Color. Moisture- Normal Moisture.  Neck Carotid Arteries- Normal color. Moisture- Normal Moisture. No carotid bruits. No JVD.  Chest and Lung Exam Auscultation: Breath Sounds:-Normal.  Cardiovascular Auscultation:Rythm- Regular. Murmurs & Other Heart Sounds:Auscultation of the heart reveals- No Murmurs.  Abdomen Inspection:-Inspeection Normal. Palpation/Percussion:Note:No mass. Palpation and Percussion of the abdomen reveal- Non Tender, Non Distended + BS, no rebound or  guarding.  Back- left si tenderness.  Neurologic Cranial Nerve exam:- CN III-XII intact(No nystagmus), symmetric smile. Strength:- 5/5 equal and symmetric strength both upper and lower extremities.  Left lower ext- no calf swelling, no pedal edema and negative homans signs.     Assessment & Plan:  Moderate blood pressure elevation today.  History of BP elevation in the past but no formal diagnosis of hypertension yet.  We will go ahead and prescribe losartan  25 mg dose to take daily.  I think this is best in light of the recent intermittent dizziness and your atypical chest pain.  Your EKG today mild bradycardia but you are resting for some time before I got into the room and an EKG was repeated.  You have some scattered artifact present.  Artifact appear to obscure interpretation T wave.  This is despite repeating the EKG.  No current pain but did have some atypical pain associated with left leg pain yesterday at 1 PM.  I am going to order stat troponin level today.  If he has any recurrent chest pain as we pursue we can have to advise ED evaluation.  I am going to go ahead and refer you to cardiology as well and hopefully they will get you in early next week.  You have intermittent transient dizziness with normal neurologic exam presently.  You have any gross motor or sensory neurologic type symptoms then recommend ED evaluation.  For your history of sciatica and radicular pain, I want you to titrate up on your gabapentin.  That would be 100 mg twice a day for 1 next week then 3 times a day the following week.  On review you have no worrisome signs of DVT.  However it is you were to get any calf swelling or pain behind your knee then be seen in the emergency department.  Follow-up in 1 week with PCP or myself.  As needed as well.  Time spent with patient today was 45  minutes which consisted of chart review, discussing diagnoses, work up, treatment, answering questions  and  documentation.  [phone].

## 2019-08-16 ENCOUNTER — Other Ambulatory Visit: Payer: Self-pay

## 2019-08-16 ENCOUNTER — Ambulatory Visit
Admission: RE | Admit: 2019-08-16 | Discharge: 2019-08-16 | Disposition: A | Discharge status: Home / Self Care | Payer: BC Managed Care – PPO | Source: Ambulatory Visit | Attending: Orthopaedic Surgery | Admitting: Orthopaedic Surgery

## 2019-08-16 DIAGNOSIS — M545 Low back pain, unspecified: Secondary | ICD-10-CM

## 2019-08-17 ENCOUNTER — Encounter: Payer: Self-pay | Admitting: Cardiology

## 2019-08-17 ENCOUNTER — Other Ambulatory Visit: Payer: Self-pay

## 2019-08-17 ENCOUNTER — Ambulatory Visit: Payer: BC Managed Care – PPO | Admitting: Cardiology

## 2019-08-17 DIAGNOSIS — E785 Hyperlipidemia, unspecified: Secondary | ICD-10-CM | POA: Insufficient documentation

## 2019-08-17 DIAGNOSIS — I10 Essential (primary) hypertension: Secondary | ICD-10-CM

## 2019-08-17 DIAGNOSIS — R079 Chest pain, unspecified: Secondary | ICD-10-CM

## 2019-08-17 DIAGNOSIS — R0789 Other chest pain: Secondary | ICD-10-CM

## 2019-08-17 HISTORY — DX: Essential (primary) hypertension: I10

## 2019-08-17 HISTORY — DX: Chest pain, unspecified: R07.9

## 2019-08-17 HISTORY — DX: Hyperlipidemia, unspecified: E78.5

## 2019-08-17 NOTE — Progress Notes (Signed)
Cardiology Consultation:    Date:  08/17/2019   ID:  Shawn Webb, DOB 10-11-1975, MRN [id number]  PCP:  Wanda Plump, MD  Cardiologist:  Gypsy Balsam, MD   Referring MD: Marisue Brooklyn   Chief Complaint  Patient presents with  . New Patient (Initial Visit)  Of chest pain  History of Present Illness:    Shawn Webb is a 44 y.o. male who is being seen today for the evaluation of chest pain at the request of [place], Ramon Dredge, [place].  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  Quite extensive evaluation has been done and it looks like you may have some spinal stenosis.  He is scheduled to see neurosurgeon next week to discuss that what make the surgery more interesting is the fact when he walks he developed pain in his left leg that prevent him from walking much but the same time he also developed some chest sensation he graded sensation scale of 10 only 2, and there is no aggravating or relieving factor except for walking and resting, taking deep breath coughing does not make it worse.  There is no radiation of the sensation.  Has been gone for last few weeks.  It is interesting because before he did have pain in his leg but no chest sensation. Risk factors for coronary artery disease include recently recognized hypertension, he had mildly elevated cholesterol with a latest LDL of 130, He smoked only for 65months long time ago. Does not have family history of premature coronary artery disease  Past Medical History:  Diagnosis Date  . Allergy   . Chicken pox   . Elevated BP     Past Surgical History:  Procedure Laterality Date  . NO PAST SURGERIES      Current Medications: Current Meds  Medication Sig  . albuterol (PROVENTIL HFA;VENTOLIN HFA) 108 (90 Base) MCG/ACT inhaler Inhale 2 puffs into the lungs every 6  (six) hours as needed for wheezing or shortness of breath.  . cetirizine (ZYRTEC) 5 MG tablet Take 5 mg by mouth daily.  [name] Kitchen losartan (COZAAR) 25 MG tablet Take 1 tablet (25 mg total) by mouth daily.     Allergies:   Sulfa antibiotics   Social History   Socioeconomic History  . Marital status: Married    Spouse name: Not on file  . Number of children: 2  . Years of education: Not on file  . Highest education level: Not on file  Occupational History  . Occupation: Runner, broadcasting/film/video, [employer], [employer]  Tobacco Use  . Smoking status: Never Smoker  . Smokeless tobacco: Never Used  Substance and Sexual Activity  . Alcohol use: Yes    Comment: socially   . Drug use: No  . Sexual activity: Not on file  Other Topics Concern  . Not on file  Social History Narrative   Children: 2011, 2016    Plays outdoor tennis, hiker.   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 Difficulty of Paying Living Expenses:   Food Insecurity:   . Worried About Programme researcher, broadcasting/film/video in the Last Year:   . [employer]  in the Last Year:   Transportation Needs:   . Film/video editor (Medical):   [name] Kitchen Lack of Transportation (Non-Medical):   Physical Activity:   . Days of Exercise per Week:   . Minutes of Exercise per Session:   Stress:   . Feeling of Stress :   Social Connections:   . Frequency of Communication with Friends and Family:   . Frequency of Social Gatherings with Friends and Family:   . Attends Religious Services:   . Active Member of Clubs or Organizations:   . Attends Archivist Meetings:   [name] Kitchen Marital Status:      Family History: The patient's family history includes Colon cancer (age of onset: 54) in his maternal grandfather. There is no history of CAD, Diabetes, or Prostate cancer. ROS:   Please see the history of present illness.    All 14 point review of  systems negative except as described per history of present illness.  EKGs/Labs/Other Studies Reviewed:    The following studies were reviewed today: Normal stress rhythm, normal P interval, nonspecific ST segment changes  EKG:  EKG is  ordered today.  The ekg ordered today demonstrates   Recent Labs: 08/12/2019: ALT 35; BUN 14; Creatinine, Ser 1.04; Hemoglobin 14.1; Platelets 128.0; Potassium 4.2; Sodium 140  Recent Lipid Panel    Component Value Date/Time   CHOL 196 06/17/2018 0917   TRIG 190.0 (H) 06/17/2018 0917   HDL 36.70 (L) 06/17/2018 0917   CHOLHDL 5 06/17/2018 0917   VLDL 38.0 06/17/2018 0917   LDLCALC 121 (H) 06/17/2018 0917   LDLDIRECT 160.0 05/05/2016 1554    Physical Exam:    VS:  BP (!) 132/98   Pulse 64   Ht 6\' 1"  (1.854 m)   Wt 220 lb (99.8 kg)   SpO2 97%   BMI 29.03 kg/m     Wt Readings from Last 3 Encounters:  08/17/19 220 lb (99.8 kg)  08/12/19 221 lb 9.6 oz (100.5 kg)  07/05/19 224 lb 4 oz (101.7 kg)     GEN:  Well nourished, well developed in no acute distress HEENT: Normal NECK: No JVD; No carotid bruits LYMPHATICS: No lymphadenopathy CARDIAC: RRR, no murmurs, no rubs, no gallops RESPIRATORY:  Clear to auscultation without rales, wheezing or rhonchi  ABDOMEN: Soft, non-tender, non-distended MUSCULOSKELETAL:  No edema; No deformity  SKIN: Warm and dry NEUROLOGIC:  Alert and oriented x 3 PSYCHIATRIC:  Normal affect   ASSESSMENT:    1. Chest pain of uncertain etiology   2. Essential hypertension   3. Other chest pain   4. Dyslipidemia    PLAN:    In order of problems listed above:  1. Chest pain with quite interesting story.  When he started walking he developed pain in the left leg which is related to his chronic back problem on top of that he developed some chest sensation.  Did look at least suspicious for coronary artery disease.  At the same time he does not have much risk factors for having coronary artery disease.  We debated  today how to look at his problem.  I think the best option for him will be to do stress testing.  We will do Lexiscan because of the fact he cannot walk because of back pain.  I wanted to put him on aspirin but is getting ready to see neurosurgery and he anticipated in injection to his back therefore will not initiate aspirin right now.  I told him not  to exert himself to the point of getting chest pain.  I will schedule him to have fasting lipid profile. 2. Essential hypertension: Very well controlled with small dose of losartan which I will continue. 3. Dyslipidemia again fasting lipid profile will be checked and then will decide about therapy based on the results of his stress test.   Medication Adjustments/Labs and Tests Ordered: Current medicines are reviewed at length with the patient today.  Concerns regarding medicines are outlined above.  No orders of the defined types were placed in this encounter.  No orders of the defined types were placed in this encounter.   Signed, Georgeanna Lea, MD, [facility]. 08/17/2019 3:56 PM    [facility]

## 2019-08-17 NOTE — Patient Instructions (Signed)
Medication Instructions:  Your physician recommends that you continue on your current medications as directed. Please refer to the Current Medication list given to you today.  *If you need a refill on your cardiac medications before your next appointment, please call your pharmacy*   Lab Work: None ordered  If you have labs (blood work) drawn today and your tests are completely normal, you will receive your results only by: [name] Kitchen MyChart Message (if you have MyChart) OR . A paper copy in the mail If you have any lab test that is abnormal or we need to change your treatment, we will call you to review the results.   Testing/Procedures: Your physician has requested that you have a lexiscan myoview. For further information please visit [email or web address] Please follow instruction sheet, as given.     Follow-Up: At [facility], you and your health needs are our priority.  As part of our continuing mission to provide you with exceptional heart care, we have created designated Provider Care Teams.  These Care Teams include your primary Cardiologist (physician) and Advanced Practice Providers (APPs -  Physician Assistants and Nurse Practitioners) who all work together to provide you with the care you need, when you need it.  We recommend signing up for the patient portal called "MyChart".  Sign up information is provided on this After Visit Summary.  MyChart is used to connect with patients for Virtual Visits (Telemedicine).  Patients are able to view lab/test results, encounter notes, upcoming appointments, etc.  Non-urgent messages can be sent to your provider as well.   To learn more about what you can do with MyChart, go to [email or web address].    Your next appointment:   1 month(s)  The format for your next appointment:   In Person  Provider:   Gypsy Balsam, MD   Other Instructions None

## 2019-08-23 ENCOUNTER — Telehealth (HOSPITAL_COMMUNITY): Payer: Self-pay | Admitting: *Deleted

## 2019-08-23 NOTE — Telephone Encounter (Signed)
Left message on voicemail per DPR in reference to upcoming appointment scheduled on 08/30/19 with detailed instructions given per Myocardial Perfusion Study Information Sheet for the test. LM to arrive 15 minutes early, and that it is imperative to arrive on time for appointment to keep from having the test rescheduled. If you need to cancel or reschedule your appointment, please call the office within 24 hours of your appointment. Failure to do so may result in a cancellation of your appointment, and a $50 no show fee. Phone number given for call back for any questions. Shawn Webb    

## 2019-09-02 ENCOUNTER — Other Ambulatory Visit: Payer: Self-pay | Admitting: Medical

## 2019-09-02 MED ORDER — LOSARTAN POTASSIUM 25 MG PO TABS: 25.0000 mg | ORAL_TABLET | Freq: Every day | ORAL | 0 refills | Status: DC

## 2019-09-04 ENCOUNTER — Other Ambulatory Visit: Payer: Self-pay | Admitting: Medical

## 2019-09-06 ENCOUNTER — Other Ambulatory Visit: Payer: Self-pay | Admitting: Medical

## 2019-09-06 MED ORDER — LOSARTAN POTASSIUM 25 MG PO TABS: 25.0000 mg | ORAL_TABLET | Freq: Every day | ORAL | 0 refills | Status: DC

## 2019-09-16 ENCOUNTER — Ambulatory Visit: Payer: BC Managed Care – PPO | Admitting: Cardiology

## 2019-10-03 ENCOUNTER — Other Ambulatory Visit: Payer: Self-pay | Admitting: Internal Medicine

## 2019-10-03 MED ORDER — LOSARTAN POTASSIUM 25 MG PO TABS: 25.0000 mg | ORAL_TABLET | Freq: Every day | ORAL | 0 refills | Status: DC

## 2019-10-03 NOTE — Telephone Encounter (Signed)
Will send 7 day supply but he is overdue for f/u. Saw Edward on 08/12/19 and was informed to f/u in 1 week.

## 2019-10-03 NOTE — Telephone Encounter (Signed)
Medication: losartan (COZAAR) 25 MG tablet    Has the patient contacted their pharmacy? No. (If no, request that the patient contact the pharmacy for the refill.) (If yes, when and what did the pharmacy advise?)  Preferred Pharmacy (with phone number or street name): [id number] [place], [place] - [address]  [address] [place] [address]  Phone:  [phone] Fax:  [phone]  DEA #:  [id number]  Agent: Please be advised that RX refills may take up to 3 business days. We ask that you follow-up with your pharmacy.

## 2019-10-11 ENCOUNTER — Telehealth: Payer: Self-pay | Admitting: Cardiology

## 2019-10-11 DIAGNOSIS — R079 Chest pain, unspecified: Secondary | ICD-10-CM

## 2019-10-11 NOTE — Telephone Encounter (Signed)
Order was re-submitted just now. I will route to scheduling to get this patient scheduled.

## 2019-10-11 NOTE — Telephone Encounter (Signed)
    Pt would like to schedule stress test. Order need to resubmit  Please advise

## 2019-10-27 ENCOUNTER — Other Ambulatory Visit: Payer: Self-pay | Admitting: Internal Medicine

## 2019-10-28 ENCOUNTER — Other Ambulatory Visit: Payer: Self-pay

## 2019-10-28 ENCOUNTER — Ambulatory Visit (INDEPENDENT_AMBULATORY_CARE_PROVIDER_SITE_OTHER): Payer: BC Managed Care – PPO | Admitting: Internal Medicine

## 2019-10-28 ENCOUNTER — Encounter: Payer: Self-pay | Admitting: Internal Medicine

## 2019-10-28 VITALS — BP 137/83 | HR 55 | Temp 98.3°F | Resp 18 | Ht 73.0 in | Wt 221.0 lb

## 2019-10-28 DIAGNOSIS — I10 Essential (primary) hypertension: Secondary | ICD-10-CM | POA: Diagnosis not present

## 2019-10-28 DIAGNOSIS — R079 Chest pain, unspecified: Secondary | ICD-10-CM

## 2019-10-28 DIAGNOSIS — I77819 Aortic ectasia, unspecified site: Secondary | ICD-10-CM

## 2019-10-28 MED ORDER — LOSARTAN POTASSIUM 25 MG PO TABS: 25.0000 mg | ORAL_TABLET | Freq: Every day | ORAL | 1 refills | Status: DC

## 2019-10-28 NOTE — Progress Notes (Signed)
Pre visit review using our clinic review tool, if applicable. No additional management support is needed unless otherwise documented below in the visit note. 

## 2019-10-28 NOTE — Patient Instructions (Signed)
Check the  blood pressure   weekly   BP GOAL is between 110/65 and  135/85. If it is consistently higher or lower, let me know      GO TO THE FRONT DESK, PLEASE SCHEDULE YOUR APPOINTMENTS Come back for a physical in 3-4 months

## 2019-10-28 NOTE — Progress Notes (Signed)
Subjective:    Patient ID: Shawn Webb, male    DOB: 07-21-1975, 44 y.o.   MRN: [id number]  DOS:  10/28/2019 Type of visit - description: Acute Went to an outside facility June 12. He was driving multiple hours , developed chest pain and difficulty breathing. 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 Chest x-ray no active disease sodium 140, potassium 4.1, glucose 101, creatinine 1.04, LFTs normal Troponin negative Hemoglobin 14.9, platelets 153, WBC 6.9 He was released home, since then he continue with back pain and chest pain.  He is able to be very active going hiking without problems but when he is sitting for prolonged periods of time he developed back pain along with a left-sided anterior chest pain. The chest pain does not radiate to the neck or back, is described as a cramp, not burning.  Denies fever chills or cough  Review of Systems See above   Past Medical History:  Diagnosis Date  . Allergy   . Chicken pox   . Elevated BP     Past Surgical History:  Procedure Laterality Date  . NO PAST SURGERIES      Allergies as of 10/28/2019      Reactions   Sulfa Antibiotics Other (See Comments)   Unknown      Medication List       Accurate as of October 28, 2019  3:39 PM. If you have any questions, ask your nurse or doctor.        albuterol 108 (90 Base) MCG/ACT inhaler Commonly known as: VENTOLIN HFA Inhale 2 puffs into the lungs every 6 (six) hours as needed for wheezing or shortness of breath.   cetirizine 5 MG tablet Commonly known as: ZYRTEC Take 5 mg by mouth daily.   losartan 25 MG tablet Commonly known as: COZAAR Take 1 tablet (25 mg total) by mouth daily.          Objective:   Physical Exam BP 137/83 (BP Location: Left Arm, Patient Position: Sitting, Cuff Size: Normal)   Pulse (!) 55   Temp 98.3 F (36.8 C) (Oral)   Resp 18    Ht 6\' 1"  (1.854 m)   Wt 221 lb (100.2 kg)   SpO2 98%   BMI 29.16 kg/m  General:   Well developed, NAD, BMI noted. HEENT:  Normocephalic . Face symmetric, atraumatic Lungs:  CTA B Normal respiratory effort, no intercostal retractions, no accessory muscle use. Heart: RRR,  no murmur.  Lower extremities: no pretibial edema bilaterally  Skin: Not pale. Not jaundice Neurologic:  alert & oriented X3.  Speech normal, gait appropriate for age and unassisted Psych--  Cognition and judgment appear intact.  Cooperative with normal attention span and concentration.  Behavior appropriate. No anxious or depressed appearing.      Assessment     Assessment HTN Blood donor (q 3 months) Mild thrombocytopenia Allergic rhinitis Rosacea: face Eczema (elbows)  PLAN: HTN: Currently on losartan, ambulatory BPs are great, recent electrolytes normal, RF losartan. 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 Again the patient has chest pain, at rest, never severe. Given the combination of chest pain and aortic dilatation we are referring back to cardiology although I believe symptoms are not related with the dilatation of the aorta.  Nevertheless, if he ever has a severe or unusual chest pain: Go to the ER Chest pain: As above RTC 3 to 4 months CPX    This visit occurred during the SARS-CoV-2 public health emergency.  Safety protocols were in place, including screening questions prior to the visit, additional usage of staff PPE, and extensive cleaning of exam room while observing appropriate contact time as indicated for disinfecting solutions.

## 2019-11-01 ENCOUNTER — Telehealth: Payer: Self-pay | Admitting: Internal Medicine

## 2019-11-01 ENCOUNTER — Telehealth (HOSPITAL_COMMUNITY): Payer: Self-pay | Admitting: Cardiology

## 2019-11-01 DIAGNOSIS — R079 Chest pain, unspecified: Secondary | ICD-10-CM

## 2019-11-01 DIAGNOSIS — I77819 Aortic ectasia, unspecified site: Secondary | ICD-10-CM

## 2019-11-01 NOTE — Telephone Encounter (Signed)
I called patient to reschedule the Myoview that Dr. Bing Matter ordered at last ov and patient states that he has  Been back in the ED in [facility] since he saw him.  Patient  Has since Dr. Drue Novel and please see his last office note in Epic that patient has been diagnosed with a aortic dilation and patient was told that it may change what Dr. Bing Matter needs him tohave.  Please call patient to advise if Myoview is still needed and if not please cancel order.  I will be glad to call and schedule if still needed. Thank you, Misty Stanley

## 2019-11-01 NOTE — Telephone Encounter (Signed)
Will try a different provider at [facility] office. Referral placed.

## 2019-11-01 NOTE — Telephone Encounter (Signed)
Patient has a referral  with a cardiologist , patient states they cant see him till August , patient would like to possibly , like a different office for a sooner date . Please advise .

## 2019-11-01 NOTE — Assessment & Plan Note (Signed)
HTN: Currently on losartan, ambulatory BPs are great, recent electrolytes normal, RF losartan. Acute back pain: See last visit, saw Ortho, had a local injection, improving, still has episode of back pain interestingly mostly at rest Aortic dilatation: Having episode of chest pain, recently he had one associated with shortness of breath, that is what him go to the ER, work-up showed  an aneurysmal dilatation of the ascending aorta 4.3 cm per CT. Again the patient has chest pain, at rest, never severe. Given the combination of chest pain and aortic dilatation we are referring back to cardiology although I believe symptoms are not related with the dilatation of the aorta.  Nevertheless, if he ever has a severe or unusual chest pain: Go to the ER Chest pain: As above RTC 3 to 4 months CPX

## 2019-11-02 NOTE — Telephone Encounter (Signed)
He still need to have Lexiscan done

## 2019-11-02 NOTE — Telephone Encounter (Signed)
Pt is established with Dr. Bing Matter (07/2019). There have been 4 different referrals placed for this pt in the past 3 months. Once established they do not transfer between providers typically. Dr. Bing Matter is at [facility] and [place] offices and 8/3 is his first available per Wynona Canes with [facility].   Clinical staff can contact [facility] with notification for "urgent" appointment. They may be able to schedule with another provider with approval. The alternative is to refer outside of [facility] to a different practice. I would need a new referral if pt wants to go to [facility], [place], etc.

## 2019-11-03 ENCOUNTER — Telehealth: Payer: Self-pay | Admitting: Cardiology

## 2019-11-03 NOTE — Telephone Encounter (Signed)
Called patient and informed him per Dr. Bing Matter that the patient still needs the stress test. He verbally understood. No further questions.

## 2019-11-03 NOTE — Telephone Encounter (Signed)
Left message for patient to return call.

## 2019-11-03 NOTE — Telephone Encounter (Signed)
Transferred call to Hayley 

## 2019-11-03 NOTE — Telephone Encounter (Signed)
Noted  

## 2019-11-03 NOTE — Telephone Encounter (Signed)
My recommendation is to stay with the cardiologist he already knows and see him at the scheduled time, no need for a urgent appointment, if he has severe chest pain he is to go to the ER.  This was discussed with the patient at the time of the visit.

## 2019-11-08 ENCOUNTER — Other Ambulatory Visit: Payer: Self-pay

## 2019-11-08 ENCOUNTER — Ambulatory Visit (INDEPENDENT_AMBULATORY_CARE_PROVIDER_SITE_OTHER): Payer: BC Managed Care – PPO

## 2019-11-08 DIAGNOSIS — R079 Chest pain, unspecified: Secondary | ICD-10-CM | POA: Diagnosis not present

## 2019-11-08 LAB — MYOCARDIAL PERFUSION IMAGING
LV dias vol: 161 mL (ref 62–150)
LV sys vol: 75 mL
Peak HR: 88 {beats}/min
Rest HR: 47 {beats}/min
SDS: 0
SRS: 1
SSS: 1
TID: 1.04

## 2019-11-08 MED ORDER — REGADENOSON 0.4 MG/5ML IV SOLN
0.4000 mg | Freq: Once | INTRAVENOUS | Status: AC
  Administered 2019-11-08: 0.4 mg via INTRAVENOUS

## 2019-11-08 MED ORDER — TECHNETIUM TC 99M TETROFOSMIN IV KIT
30.3000 | PACK | Freq: Once | INTRAVENOUS | Status: AC | PRN
  Administered 2019-11-08: 30.3 via INTRAVENOUS

## 2019-11-08 MED ORDER — TECHNETIUM TC 99M TETROFOSMIN IV KIT
10.4000 | PACK | Freq: Once | INTRAVENOUS | Status: AC | PRN
Start: 2019-11-08 — End: 2019-11-08
  Administered 2019-11-08: 10.4 via INTRAVENOUS

## 2019-11-09 ENCOUNTER — Telehealth: Payer: Self-pay | Admitting: Cardiology

## 2019-11-09 NOTE — Telephone Encounter (Signed)
  Patient is calling to speak with the nurse to go over his stress test results. Please call back.

## 2019-11-21 NOTE — Telephone Encounter (Signed)
Patient has seen results in mychart and message was sent to him to call with any questions.

## 2019-11-23 ENCOUNTER — Ambulatory Visit: Payer: BC Managed Care – PPO | Admitting: Cardiology

## 2019-11-29 ENCOUNTER — Encounter: Payer: Self-pay | Admitting: Cardiology

## 2019-11-29 ENCOUNTER — Ambulatory Visit: Payer: BC Managed Care – PPO | Admitting: Cardiology

## 2019-11-29 ENCOUNTER — Other Ambulatory Visit: Payer: Self-pay

## 2019-11-29 VITALS — BP 142/98 | HR 53 | Ht 73.0 in | Wt 222.4 lb

## 2019-11-29 DIAGNOSIS — I712 Thoracic aortic aneurysm, without rupture: Secondary | ICD-10-CM | POA: Diagnosis not present

## 2019-11-29 DIAGNOSIS — R079 Chest pain, unspecified: Secondary | ICD-10-CM

## 2019-11-29 DIAGNOSIS — E785 Hyperlipidemia, unspecified: Secondary | ICD-10-CM

## 2019-11-29 DIAGNOSIS — I7121 Aneurysm of the ascending aorta, without rupture: Secondary | ICD-10-CM

## 2019-11-29 DIAGNOSIS — I10 Essential (primary) hypertension: Secondary | ICD-10-CM | POA: Diagnosis not present

## 2019-11-29 HISTORY — DX: Thoracic aortic aneurysm, without rupture: I71.2

## 2019-11-29 HISTORY — DX: Aneurysm of the ascending aorta, without rupture: I71.21

## 2019-11-29 NOTE — Patient Instructions (Signed)
Medication Instructions:  No medication changes. *If you need a refill on your cardiac medications before your next appointment, please call your pharmacy*   Lab Work: Your physician recommends that you have a BMET today in the office.  If you have labs (blood work) drawn today and your tests are completely normal, you will receive your results only by: [name] Kitchen MyChart Message (if you have MyChart) OR . A paper copy in the mail If you have any lab test that is abnormal or we need to change your treatment, we will call you to review the results.   Testing/Procedures: None ordered   Follow-Up: At [facility], you and your health needs are our priority.  As part of our continuing mission to provide you with exceptional heart care, we have created designated Provider Care Teams.  These Care Teams include your primary Cardiologist (physician) and Advanced Practice Providers (APPs -  Physician Assistants and Nurse Practitioners) who all work together to provide you with the care you need, when you need it.  We recommend signing up for the patient portal called "MyChart".  Sign up information is provided on this After Visit Summary.  MyChart is used to connect with patients for Virtual Visits (Telemedicine).  Patients are able to view lab/test results, encounter notes, upcoming appointments, etc.  Non-urgent messages can be sent to your provider as well.   To learn more about what you can do with MyChart, go to [email or web address].    Your next appointment:   5 month(s)  The format for your next appointment:   In Person  Provider:   Gypsy Balsam, MD   Other Instructions NA

## 2019-11-29 NOTE — Progress Notes (Signed)
Cardiology Office Note:    Date:  11/29/2019   ID:  Shawn Webb, DOB Apr 06, 1976, MRN [id number]  PCP:  Wanda Plump, MD  Cardiologist:  Gypsy Balsam, MD    Referring MD: Wanda Plump, MD   No chief complaint on file. I am doing well  History of Present Illness:    Shawn Webb is a 44 y.o. male he was referred to [place] because of atypical chest pain.  Couple weeks ago he was driving to the beach in [place], started having some chest pain and not going to the emergency room.  He ruled out for myocardial infarction, he did have a CT of his chest rule out PE and he was find to have ascending aortic aneurysm measuring 43 mm.  Pain was very atypical and eventually he was discharged home, end up going to the beach and had a great time for about a week.  Since that time he gradually started exercising have no difficulty doing it.  We did stress test on him which showed no evidence of ischemia.  He does have some back problem he is receiving epidural injections there as well as some physical therapy which seems to be helping.  Past Medical History:  Diagnosis Date  . Allergy   . Chicken pox   . Elevated BP     Past Surgical History:  Procedure Laterality Date  . NO PAST SURGERIES      Current Medications: Current Meds  Medication Sig  . cetirizine (ZYRTEC) 5 MG tablet Take 5 mg by mouth daily.  [name] Kitchen losartan (COZAAR) 25 MG tablet Take 1 tablet (25 mg total) by mouth daily.     Allergies:   Sulfa antibiotics   Social History   Socioeconomic History  . Marital status: Married    Spouse name: Not on file  . Number of children: 2  . Years of education: Not on file  . Highest education level: Not on file  Occupational History  . Occupation: Runner, broadcasting/film/video, [employer], [employer]  Tobacco Use  . Smoking status: Never Smoker  . Smokeless tobacco: Never Used  Substance and Sexual Activity  . Alcohol use: Yes    Comment: socially   . Drug use: No  . Sexual activity:  Not on file  Other Topics Concern  . Not on file  Social History Narrative   Children: 2011, 2016    Plays outdoor tennis, hiker.   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 Difficulty of Paying Living Expenses:   Food Insecurity:   . Worried About Programme researcher, broadcasting/film/video in the Last Year:   . Barista in the Last Year:   Transportation Needs:   . Freight forwarder (Medical):   [name] Kitchen Lack of Transportation (Non-Medical):   Physical Activity:   . Days of Exercise per Week:   . Minutes of Exercise per Session:   Stress:   . Feeling of Stress :   Social Connections:   . Frequency of Communication with Friends and Family:   . Frequency of Social Gatherings with Friends and Family:   . Attends Religious Services:   . Active Member of Clubs or Organizations:   . Attends Banker Meetings:   [name] Kitchen Marital Status:      Family History: The patient's family history includes  Colon cancer (age of onset: 70) in his maternal grandfather. There is no history of CAD, Diabetes, or Prostate cancer. ROS:   Please see the history of present illness.    All 14 point review of systems negative except as described per history of present illness  EKGs/Labs/Other Studies Reviewed:      Recent Labs: 08/12/2019: ALT 35; BUN 14; Creatinine, Ser 1.04; Hemoglobin 14.1; Platelets 128.0; Potassium 4.2; Sodium 140  Recent Lipid Panel    Component Value Date/Time   CHOL 196 06/17/2018 0917   TRIG 190.0 (H) 06/17/2018 0917   HDL 36.70 (L) 06/17/2018 0917   CHOLHDL 5 06/17/2018 0917   VLDL 38.0 06/17/2018 0917   LDLCALC 121 (H) 06/17/2018 0917   LDLDIRECT 160.0 05/05/2016 1554    Physical Exam:    VS:  BP (!) 142/98 (BP Location: Right Arm, Patient Position: Sitting, Cuff Size: Normal)   Pulse (!) 53   Ht 6\' 1"  (1.854 m)   Wt 222 lb 6.4 oz (100.9 kg)   SpO2 96%   BMI 29.34 kg/m       Wt Readings from Last 3 Encounters:  11/29/19 222 lb 6.4 oz (100.9 kg)  11/08/19 221 lb (100.2 kg)  10/28/19 221 lb (100.2 kg)     GEN:  Well nourished, well developed in no acute distress HEENT: Normal NECK: No JVD; No carotid bruits LYMPHATICS: No lymphadenopathy CARDIAC: RRR, no murmurs, no rubs, no gallops RESPIRATORY:  Clear to auscultation without rales, wheezing or rhonchi  ABDOMEN: Soft, non-tender, non-distended MUSCULOSKELETAL:  No edema; No deformity  SKIN: Warm and dry LOWER EXTREMITIES: no swelling NEUROLOGIC:  Alert and oriented x 3 PSYCHIATRIC:  Normal affect   ASSESSMENT:    1. Essential hypertension   2. Ascending aortic aneurysm (HCC)   3. Dyslipidemia   4. Chest pain of uncertain etiology    PLAN:    In order of problems listed above:  1. Essential hypertension still not perfectly controlled, I will check his Chem-7 if Chem-7 is fine will double the dose of losartan. 2. Ascending arctic aneurysm: Measuring 43 mm.  Since this is incidental discovery, surprising I would prefer him to have another CT done within the next 6 months to make sure that is stable.  Apparently there were no acute future of that aneurysm on CT that he had done in 12/29/19, however, I do not have official report in front of me.  Luckily he is completely asymptomatic.  We did talk about aneurysm and I told him what he need to do to prevent this thing from getting larger meaning avoiding isovolumetric exercises also mentioned that we need to get his blood pressure better controlled, and I hope, will be able to do it with increasing dose of losartan. 3. Dyslipidemia: Diet and exercises for now and then will discuss potentially statin therapy. 4. Chest pain so far work-up negative.  Pain was very atypical happening at rest.  Now he is able to exercise because of his nonobese I told him to avoid isovolumetric exercises.   Medication Adjustments/Labs and Tests Ordered: Current medicines are  reviewed at length with the patient today.  Concerns regarding medicines are outlined above.  No orders of the defined types were placed in this encounter.  Medication changes: No orders of the defined types were placed in this encounter.   Signed, [place], MD, [facility] 11/29/2019 12:06 PM    [facility]

## 2019-11-30 LAB — BASIC METABOLIC PANEL
BUN/Creatinine Ratio: 11 (ref 9–20)
BUN: 12 mg/dL (ref 6–24)
CO2: 23 mmol/L (ref 20–29)
Calcium: 9.4 mg/dL (ref 8.7–10.2)
Chloride: 102 mmol/L (ref 96–106)
Creatinine, Ser: 1.08 mg/dL (ref 0.76–1.27)
GFR calc Af Amer: 97 mL/min/{1.73_m2} (ref >59)
GFR calc non Af Amer: 84 mL/min/{1.73_m2} (ref >59)
Glucose: 88 mg/dL (ref 65–99)
Potassium: 4.5 mmol/L (ref 3.5–5.2)
Sodium: 140 mmol/L (ref 134–144)

## 2019-12-15 ENCOUNTER — Telehealth: Payer: Self-pay | Admitting: Cardiology

## 2019-12-15 DIAGNOSIS — I10 Essential (primary) hypertension: Secondary | ICD-10-CM

## 2019-12-15 MED ORDER — LOSARTAN POTASSIUM 50 MG PO TABS: 50.0000 mg | ORAL_TABLET | Freq: Every day | ORAL | 3 refills | Status: DC

## 2019-12-15 NOTE — Telephone Encounter (Signed)
Yes, she is to double her losartan from 25 mg daily to 50 mg a day, week later she needs to have Chem-7 done to make sure kidney function is well

## 2019-12-15 NOTE — Telephone Encounter (Signed)
Pt c/o medication issue:  1. Name of Medication: losartan (COZAAR) 25 MG tablet  2. How are you currently taking this medication (dosage and times per day)? 25 mg daily  3. Are you having a reaction (difficulty breathing--STAT)? no  4. What is your medication issue? Pt wanted to know if he needed to start taking more of the medication or not. At his last office visit it was brought up that Dr. Kirtland Bouchard may want to increase the medication based on the results of some blood work. The patient had labs drawn but has not been informed about whether or not he is supposed to increase the dosage of the medication.  If he is to increase the dosage he would need a new rx sent to his pharmacy [phone] [place], [place] - [address])

## 2019-12-15 NOTE — Telephone Encounter (Signed)
Spoke with patient regarding recommendations.  Patient verbalizes understanding and is agreeable to plan of care. Advised patient to call back with any issues or concerns.  

## 2020-02-01 ENCOUNTER — Other Ambulatory Visit: Payer: Self-pay

## 2020-02-01 ENCOUNTER — Encounter: Payer: Self-pay | Admitting: Internal Medicine

## 2020-02-01 ENCOUNTER — Ambulatory Visit (INDEPENDENT_AMBULATORY_CARE_PROVIDER_SITE_OTHER): Payer: BC Managed Care – PPO | Admitting: Internal Medicine

## 2020-02-01 VITALS — BP 138/83 | HR 62 | Temp 98.3°F | Resp 16 | Ht 73.0 in | Wt 215.4 lb

## 2020-02-01 DIAGNOSIS — Z23 Encounter for immunization: Secondary | ICD-10-CM | POA: Diagnosis not present

## 2020-02-01 DIAGNOSIS — I10 Essential (primary) hypertension: Secondary | ICD-10-CM | POA: Diagnosis not present

## 2020-02-01 DIAGNOSIS — Z0000 Encounter for general adult medical examination without abnormal findings: Secondary | ICD-10-CM | POA: Diagnosis not present

## 2020-02-01 DIAGNOSIS — E785 Hyperlipidemia, unspecified: Secondary | ICD-10-CM

## 2020-02-01 DIAGNOSIS — Z1159 Encounter for screening for other viral diseases: Secondary | ICD-10-CM

## 2020-02-01 NOTE — Patient Instructions (Signed)
Check the  blood pressure 2 or 3 times a month   BP GOAL is between 110/65 and  135/85. If it is consistently higher or lower, let me know   GENERAL INFORMATION ABOUT HEALTHY EATING, CHOLESTEROL, HIGH BLOOD PRESSURE, QUIT TOBACCO (if you smoke):  The American Heart Association, www.heart.org  Check the "Life's Simple 7" from the [facility] The American diabetes Association www.diabetes.org  "[facility] A to Z Health Guide" book    GO TO THE LAB : Get the blood work     GO TO THE FRONT DESK, PLEASE SCHEDULE YOUR APPOINTMENTS Come back for   for a checkup in 6 to 8 months, fasting

## 2020-02-01 NOTE — Assessment & Plan Note (Signed)
Here for CPX HTN: Recently losartan increased to 50 mg daily, good compliance and tolerance, BPs are very good at 130/80. Ascending Ao dilatation, chest pain: Chart reviewed, stress test was negative, cardiology plan to recheck a CT aorta ~ 05-2020. Last LDL of 121, 10-year CV risk calculated at 2.6%.  Recommend to watch diet closely and recheck on RTC. Back pain: Sees Ortho, had an MRI, had 3 local injections.  Overall improved, able to take hikes and play tennis.  Symptoms are not completely resolved though. RTC 6-8  months

## 2020-02-01 NOTE — Progress Notes (Signed)
   Subjective:    Patient ID: Shawn Webb, male    DOB: 1975/06/22, 44 y.o.   MRN: [id number]  DOS:  02/01/2020 Type of visit - description: CPX Since the last office visit saw cardiology, work-up was done, chart reviewed. Continue with back pain but it has definitely decreased.  Still has occasional chest pain, never exertional, always associated with back pain.   Review of Systems  Other than above, a 14 point review of systems is negative     Past Medical History:  Diagnosis Date  . Allergy   . Chicken pox   . Hypertension     Past Surgical History:  Procedure Laterality Date  . NO PAST SURGERIES      Allergies as of 02/01/2020      Reactions   Sulfa Antibiotics Other (See Comments)   Unknown      Medication List       Accurate as of February 01, 2020  9:23 PM. If you have any questions, ask your nurse or doctor.        cetirizine 5 MG tablet Commonly known as: ZYRTEC Take 5 mg by mouth daily.   losartan 50 MG tablet Commonly known as: COZAAR Take 1 tablet (50 mg total) by mouth daily.          Objective:   Physical Exam BP 138/83 (BP Location: Left Arm, Patient Position: Sitting, Cuff Size: Normal)   Pulse 62   Temp 98.3 F (36.8 C) (Oral)   Resp 16   Ht 6\' 1"  (1.854 m)   Wt 215 lb 6 oz (97.7 kg)   SpO2 100%   BMI 28.42 kg/m  General: Well developed, NAD, BMI noted Neck: No  thyromegaly  HEENT:  Normocephalic . Face symmetric, atraumatic Lungs:  CTA B Normal respiratory effort, no intercostal retractions, no accessory muscle use. Heart: RRR,  no murmur.  Abdomen:  Not distended, soft, non-tender. No rebound or rigidity.   Lower extremities: no pretibial edema bilaterally  Skin: Exposed areas without rash. Not pale. Not jaundice Neurologic:  alert & oriented X3.  Speech normal, gait appropriate for age and unassisted Strength symmetric and appropriate for age.  Psych: Cognition and judgment appear intact.  Cooperative with normal  attention span and concentration.  Behavior appropriate. No anxious or depressed appearing.     Assessment       Assessment HTN Blood donor (q 3 months) Mild thrombocytopenia Allergic rhinitis Rosacea: face Eczema (elbows) Ascending aortic dilatation, chest pain: (-) Stress test 10-2019.  Repeat CT chest 05/2020   PLAN: Here for CPX HTN: Recently losartan increased to 50 mg daily, good compliance and tolerance, BPs are very good at 130/80. Ascending Ao dilatation, chest pain: Chart reviewed, stress test was negative, cardiology plan to recheck a CT aorta ~ 05-2020. Last LDL of 121, 10-year CV risk calculated at 2.6%.  Recommend to watch diet closely and recheck on RTC. Back pain: Sees Ortho, had an MRI, had 3 local injections.  Overall improved, able to take hikes and play tennis.  Symptoms are not completely resolved though. RTC 6-8  months   This visit occurred during the SARS-CoV-2 public health emergency.  Safety protocols were in place, including screening questions prior to the visit, additional usage of staff PPE, and extensive cleaning of exam room while observing appropriate contact time as indicated for disinfecting solutions.

## 2020-02-01 NOTE — Progress Notes (Signed)
Pre visit review using our clinic review tool, if applicable. No additional management support is needed unless otherwise documented below in the visit note. 

## 2020-02-01 NOTE — Assessment & Plan Note (Signed)
-  Tdap 2013 - C-19: s/p moderna , last 06/2019, watch CDC advise regards booster  - flu shot today -CCS: no FH, never had a Cscope - prostrate ca screening: not indicated  -Labs:BMP,  hep C -Diet and exercise discussed

## 2020-02-02 LAB — BASIC METABOLIC PANEL WITH GFR
BUN: 15 mg/dL (ref 7–25)
CO2: 27 mmol/L (ref 20–32)
Calcium: 9.6 mg/dL (ref 8.6–10.3)
Chloride: 105 mmol/L (ref 98–110)
Creat: 1.16 mg/dL (ref 0.60–1.35)
Glucose, Bld: 81 mg/dL (ref 65–99)
Potassium: 4.1 mmol/L (ref 3.5–5.3)
Sodium: 140 mmol/L (ref 135–146)

## 2020-02-02 LAB — HEPATITIS C ANTIBODY
Hepatitis C Ab: NONREACTIVE
SIGNAL TO CUT-OFF: 0.01

## 2020-04-19 DIAGNOSIS — I10 Essential (primary) hypertension: Secondary | ICD-10-CM | POA: Insufficient documentation

## 2020-04-19 DIAGNOSIS — B019 Varicella without complication: Secondary | ICD-10-CM | POA: Insufficient documentation

## 2020-04-19 DIAGNOSIS — T7840XA Allergy, unspecified, initial encounter: Secondary | ICD-10-CM | POA: Insufficient documentation

## 2020-04-30 ENCOUNTER — Encounter: Payer: Self-pay | Admitting: Cardiology

## 2020-04-30 ENCOUNTER — Ambulatory Visit: Payer: BC Managed Care – PPO | Admitting: Cardiology

## 2020-04-30 ENCOUNTER — Other Ambulatory Visit: Payer: Self-pay

## 2020-04-30 VITALS — BP 160/100 | HR 79 | Ht 73.0 in | Wt 212.0 lb

## 2020-04-30 DIAGNOSIS — G4719 Other hypersomnia: Secondary | ICD-10-CM

## 2020-04-30 DIAGNOSIS — I7121 Aneurysm of the ascending aorta, without rupture: Secondary | ICD-10-CM

## 2020-04-30 DIAGNOSIS — I10 Essential (primary) hypertension: Secondary | ICD-10-CM

## 2020-04-30 DIAGNOSIS — E785 Hyperlipidemia, unspecified: Secondary | ICD-10-CM

## 2020-04-30 DIAGNOSIS — I712 Thoracic aortic aneurysm, without rupture: Secondary | ICD-10-CM

## 2020-04-30 MED ORDER — AMLODIPINE BESYLATE 5 MG PO TABS: 5.0000 mg | ORAL_TABLET | Freq: Every day | ORAL | 1 refills | Status: DC

## 2020-04-30 NOTE — Patient Instructions (Signed)
Medication Instructions:  Your physician has recommended you make the following change in your medication:   START: Amlodipine 5 mg daily   *If you need a refill on your cardiac medications before your next appointment, please call your pharmacy*   Lab Work: None. If you have labs (blood work) drawn today and your tests are completely normal, you will receive your results only by: [name] Kitchen MyChart Message (if you have MyChart) OR . A paper copy in the mail If you have any lab test that is abnormal or we need to change your treatment, we will call you to review the results.   Testing/Procedures: Non-Cardiac CT scanning, (CAT scanning), is a noninvasive, special x-ray that produces cross-sectional images of the body using x-rays and a computer. CT scans help physicians diagnose and treat medical conditions. For some CT exams, a contrast material is used to enhance visibility in the area of the body being studied. CT scans provide greater clarity and reveal more details than regular x-ray exams.     Follow-Up: At [facility], you and your health needs are our priority.  As part of our continuing mission to provide you with exceptional heart care, we have created designated Provider Care Teams.  These Care Teams include your primary Cardiologist (physician) and Advanced Practice Providers (APPs -  Physician Assistants and Nurse Practitioners) who all work together to provide you with the care you need, when you need it.  We recommend signing up for the patient portal called "MyChart".  Sign up information is provided on this After Visit Summary.  MyChart is used to connect with patients for Virtual Visits (Telemedicine).  Patients are able to view lab/test results, encounter notes, upcoming appointments, etc.  Non-urgent messages can be sent to your provider as well.   To learn more about what you can do with MyChart, go to [email or web address].    Your next appointment:   2 month(s)  The  format for your next appointment:   In Person  Provider:   Gypsy Balsam, MD   Other Instructions  Amlodipine Oral Tablets What is this medicine? AMLODIPINE (am LOE di peen) is a calcium channel blocker. It relaxes your blood vessels and decreases the amount of work the heart has to do. It treats high blood pressure and/or prevents chest pain (also called angina). This medicine may be used for other purposes; ask your health care provider or pharmacist if you have questions. COMMON BRAND NAME(S): Norvasc What should I tell my health care provider before I take this medicine? They need to know if you have any of these conditions:  heart disease  liver disease  an unusual or allergic reaction to amlodipine, other drugs, foods, dyes, or preservatives  pregnant or trying to get pregnant  breast-feeding How should I use this medicine? Take this drug by mouth. Take it as directed on the prescription label at the same time every day. You can take it with or without food. If it upsets your stomach, take it with food. Keep taking it unless your health care provider tells you to stop. Talk to your health care provider about the use of this drug in children. While it may be prescribed for children as young as 6 for selected conditions, precautions do apply. Overdosage: If you think you have taken too much of this medicine contact a poison control center or emergency room at once. NOTE: This medicine is only for you. Do not share this medicine with others. What if I miss  a dose? If you miss a dose, take it as soon as you can. If it is almost time for your next dose, take only that dose. Do not take double or extra doses. What may interact with this medicine? This medicine may interact with the following medications:  clarithromycin  cyclosporine  diltiazem  itraconazole  simvastatin  tacrolimus This list may not describe all possible interactions. Give your health care provider  a list of all the medicines, herbs, non-prescription drugs, or dietary supplements you use. Also tell them if you smoke, drink alcohol, or use illegal drugs. Some items may interact with your medicine. What should I watch for while using this medicine? Visit your health care provider for regular checks on your progress. Check your blood pressure as directed. Ask your health care provider what your blood pressure should be. Also, find out when you should contact him or her. Do not treat yourself for coughs, colds, or pain while you are using this drug without asking your health care provider for advice. Some drugs may increase your blood pressure. You may get drowsy or dizzy. Do not drive, use machinery, or do anything that needs mental alertness until you know how this drug affects you. Do not stand up or sit up quickly, especially if you are an older patient. This reduces the risk of dizzy or fainting spells. What side effects may I notice from receiving this medicine? Side effects that you should report to your doctor or health care provider as soon as possible:  allergic reactions (skin rash, itching or hives; swelling of the face, lips, or tongue)  heart attack (trouble breathing; pain or tightness in the chest, neck, back or arms; unusually weak or tired)  low blood pressure (dizziness; feeling faint or lightheaded, falls; unusually weak or tired) Side effects that usually do not require medical attention (report these to your doctor or health care provider if they continue or are bothersome):  facial flushing  nausea  palpitations  stomach pain  sudden weight gain  swelling of the ankles, feet, hands This list may not describe all possible side effects. Call your doctor for medical advice about side effects. You may report side effects to FDA at 1-800-FDA-1088. Where should I keep my medicine? Keep out of the reach of children and pets. Store at room temperature between 59 and 86  degrees F (15 and 30 degrees C). Protect from light and moisture. Keep the container tightly closed. Throw away any unused drug after the expiration date. NOTE: This sheet is a summary. It may not cover all possible information. If you have questions about this medicine, talk to your doctor, pharmacist, or health care provider.  2020 Elsevier/Gold Standard (2019-01-18 19:39:45)

## 2020-04-30 NOTE — Progress Notes (Signed)
Cardiology Office Note:    Date:  04/30/2020   ID:  Shawn Webb, DOB 1976/02/01, MRN [id number]  PCP:  Wanda Plump, MD  Cardiologist:  Gypsy Balsam, MD    Referring MD: Wanda Plump, MD   Chief Complaint  Patient presents with  . Hypertension    History of Present Illness:    Shawn Webb is a 45 y.o. male with past medical history significant for atypical chest pain, stress test done in summer negative, ascending octagon was measuring 37 mm, essential hypertension, dyslipidemia.  He comes today 2 months for follow-up he is now doing well from his back point of view he still complain of having a lot of back pain.  He did have some physical therapy which seems to be helping somewhat but he is getting ready to talk to surgery about potentially doing some intervention at the same time he tells me that he exercise on the regular basis he can run for 3 to 4 miles and have no difficulties.  He actually tells me that his back is doing better when he does things rather than when he is resting.  Is also very anxious about the entire coronavirus situation he is worried about that.  Past Medical History:  Diagnosis Date  . Allergic rhinitis 11/21/2011  . Allergy   . Annual physical exam 11/21/2011  . Ascending aortic aneurysm (HCC) 11/29/2019  . Chest pain of uncertain etiology 08/17/2019  . Chicken pox   . Dyslipidemia 08/17/2019  . Essential hypertension 08/17/2019  . Hypertension   . PCP NOTES >>>> 03/05/2015    Past Surgical History:  Procedure Laterality Date  . NO PAST SURGERIES      Current Medications: Current Meds  Medication Sig  . cetirizine (ZYRTEC) 5 MG tablet Take 5 mg by mouth daily.  [name] Kitchen losartan (COZAAR) 50 MG tablet Take 1 tablet (50 mg total) by mouth daily.  . naproxen sodium (ALEVE) 220 MG tablet Take 220 mg by mouth.  . vitamin C (ASCORBIC ACID) 250 MG tablet Take 250 mg by mouth daily.     Allergies:   Sulfa antibiotics   Social History   Socioeconomic  History  . Marital status: Married    Spouse name: Not on file  . Number of children: 2  . Years of education: Not on file  . Highest education level: Not on file  Occupational History  . Occupation: Runner, broadcasting/film/video, [employer], [employer]  Tobacco Use  . Smoking status: Never Smoker  . Smokeless tobacco: Never Used  Substance and Sexual Activity  . Alcohol use: Yes    Comment: socially   . Drug use: No  . Sexual activity: Not on file  Other Topics Concern  . Not on file  Social History Narrative   Children: 2011, 2016    Plays outdoor tennis, hiker.    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 There is no history of CAD, Diabetes, or Prostate cancer. ROS:   Please see the history of present illness.    All 14 point review of systems negative except as described per history  of present illness  EKGs/Labs/Other Studies Reviewed:      Recent Labs: 08/12/2019: ALT 35; Hemoglobin 14.1; Platelets 128.0 02/01/2020: BUN 15; Creat 1.16; Potassium 4.1; Sodium 140  Recent Lipid Panel    Component Value Date/Time   CHOL 196 06/17/2018 0917   TRIG 190.0 (H) 06/17/2018 0917   HDL 36.70 (L) 06/17/2018 0917   CHOLHDL 5 06/17/2018 0917   VLDL 38.0 06/17/2018 0917   LDLCALC 121 (H) 06/17/2018 0917   LDLDIRECT 160.0 05/05/2016 1554    Physical Exam:    VS:  BP (!) 160/100 (BP Location: Left Arm, Patient Position: Sitting)   Pulse 79   Ht 6\' 1"  (1.854 m)   Wt 212 lb (96.2 kg)   SpO2 97%   BMI 27.97 kg/m     Wt Readings from Last 3 Encounters:  04/30/20 212 lb (96.2 kg)  02/01/20 215 lb 6 oz (97.7 kg)  11/29/19 222 lb 6.4 oz  (100.9 kg)     GEN:  Well nourished, well developed in no acute distress HEENT: Normal NECK: No JVD; No carotid bruits LYMPHATICS: No lymphadenopathy CARDIAC: RRR, no murmurs, no rubs, no gallops RESPIRATORY:  Clear to auscultation without rales, wheezing or rhonchi  ABDOMEN: Soft, non-tender, non-distended MUSCULOSKELETAL:  No edema; No deformity  SKIN: Warm and dry LOWER EXTREMITIES: no swelling NEUROLOGIC:  Alert and oriented x 3 PSYCHIATRIC:  Normal affect   ASSESSMENT:    1. Essential hypertension   2. Ascending aortic aneurysm (HCC)   3. Dyslipidemia    PLAN:    In order of problems listed above:  1. Essential hypertension blood pressure elevated I will add amlodipine 5 mg to his medical regimen asked him to check blood pressure on the regular basis and bring results to me.  He may need some additional adjustment of his medications. 2. Ascending aortic aneurysm I will schedule him to have CT of his chest without contrast to check the size of the aneurysm. 3. Dyslipidemia I did review his K PN which I have data from February of this year showing LDL of 121 HDL 36.  Will try to get his blood pressure to better control and then we calculate his risk and then will initiate treatment if needed. 4. We did talk about healthy lifestyle need to exercise on the regular basis which he understand he need to do.   Medication Adjustments/Labs and Tests Ordered: Current medicines are reviewed at length with the patient today.  Concerns regarding medicines are outlined above.  No orders of the defined types were placed in this encounter.  Medication changes: No orders of the defined types were placed in this encounter.   Signed, March, MD, [facility] 04/30/2020 11:40 AM    [facility]

## 2020-05-07 ENCOUNTER — Other Ambulatory Visit: Payer: Self-pay

## 2020-05-07 ENCOUNTER — Ambulatory Visit (HOSPITAL_BASED_OUTPATIENT_CLINIC_OR_DEPARTMENT_OTHER)
Admission: RE | Admit: 2020-05-07 | Discharge: 2020-05-07 | Disposition: A | Discharge status: Home / Self Care | Payer: BC Managed Care – PPO | Source: Ambulatory Visit | Attending: Cardiology | Admitting: Cardiology

## 2020-05-07 DIAGNOSIS — I712 Thoracic aortic aneurysm, without rupture: Secondary | ICD-10-CM | POA: Diagnosis present

## 2020-05-07 DIAGNOSIS — E785 Hyperlipidemia, unspecified: Secondary | ICD-10-CM | POA: Insufficient documentation

## 2020-05-07 DIAGNOSIS — I7121 Aneurysm of the ascending aorta, without rupture: Secondary | ICD-10-CM

## 2020-05-07 DIAGNOSIS — I10 Essential (primary) hypertension: Secondary | ICD-10-CM | POA: Insufficient documentation

## 2020-05-09 ENCOUNTER — Telehealth: Payer: Self-pay | Admitting: Cardiology

## 2020-05-09 NOTE — Telephone Encounter (Signed)
Patient would like a Nurse to call him to explain and discuss his test results that were posted on MyChart

## 2020-05-09 NOTE — Telephone Encounter (Signed)
Left message for patient to return call.

## 2020-05-09 NOTE — Telephone Encounter (Signed)
Patient informed of results.  

## 2020-05-09 NOTE — Telephone Encounter (Signed)
CT of the chest show aneurysm 4.1 cm, no need to do anything except regular follow-up

## 2020-05-09 NOTE — Telephone Encounter (Signed)
Patient is returning call to discuss CT results. 

## 2020-05-29 ENCOUNTER — Other Ambulatory Visit: Payer: Self-pay | Admitting: Orthopedic Surgery

## 2020-05-29 DIAGNOSIS — M5416 Radiculopathy, lumbar region: Secondary | ICD-10-CM

## 2020-06-05 ENCOUNTER — Telehealth: Payer: Self-pay | Admitting: *Deleted

## 2020-06-05 NOTE — Addendum Note (Signed)
Addended by: Hazle Quant on: 06/05/2020 08:40 AM   Modules accepted: Orders

## 2020-06-05 NOTE — Telephone Encounter (Signed)
Staff message sent to [facility] per [employer] no PA is required for sleep study. Ok to call and schedule.

## 2020-06-07 ENCOUNTER — Telehealth: Payer: Self-pay

## 2020-06-07 NOTE — Telephone Encounter (Signed)
-----   Message from Gaynelle Cage, CMA sent at 06/05/2020  1:41 PM EST ----- Regarding: RE: sleep study Per [employer] no PA is required. Ok to schedule. Call [phone]. ----- Message ----- From: Bertram Gala Sent: 06/05/2020   8:59 AM EST To: Hazle Quant, RN, # Subject: RE: sleep study                                Good Morning Sleep Study Team,  Passing on the [place] request below.  Hayley-Just a FYI-The sleep study team handles the auths for sleep studies.   Thank you, Bertram Gala ----- Message ----- From: Hazle Quant, RN Sent: 06/05/2020   8:41 AM EST To: [facility] Pre Cert/Auth Subject: sleep study                                    Please precert sleep study for patient per Dr. Bing Matter.

## 2020-06-07 NOTE — Telephone Encounter (Signed)
Sleep Study schedule on 07/24/20 at 8pm. Left a message to return my call.

## 2020-06-17 ENCOUNTER — Other Ambulatory Visit: Payer: Self-pay

## 2020-06-17 ENCOUNTER — Ambulatory Visit
Admission: RE | Admit: 2020-06-17 | Discharge: 2020-06-17 | Disposition: A | Discharge status: Home / Self Care | Payer: BC Managed Care – PPO | Source: Ambulatory Visit | Attending: Orthopedic Surgery | Admitting: Orthopedic Surgery

## 2020-06-17 DIAGNOSIS — M5416 Radiculopathy, lumbar region: Secondary | ICD-10-CM

## 2020-06-19 ENCOUNTER — Other Ambulatory Visit: Payer: Self-pay

## 2020-06-19 MED ORDER — LOSARTAN POTASSIUM 50 MG PO TABS: 50.0000 mg | ORAL_TABLET | Freq: Every day | ORAL | 3 refills | Status: DC

## 2020-06-26 ENCOUNTER — Other Ambulatory Visit: Payer: Self-pay | Admitting: Orthopedic Surgery

## 2020-06-27 ENCOUNTER — Telehealth: Payer: Self-pay

## 2020-06-27 NOTE — Telephone Encounter (Signed)
    [facility] Pre-operative Risk Assessment    Request for surgical clearance:  1. What type of surgery is being performed? Left sided Lumbar 5 sacrum microdisectomy   2. When is this surgery scheduled? 08/15/2020   3. What type of clearance is required (medical clearance vs. Pharmacy clearance to hold med vs. Both)? Medical  4. Are there any medications that need to be held prior to surgery and how long?None specified   5. Practice name and name of physician performing surgery? Dr. Phylliss Bob at [facility]   6. What is your office phone number:[phone]    7.   What is your office fax [phone]  8.   Anesthesia type (None, local, MAC, general) ? Not specified   Shawn Webb 06/27/2020, 4:18 PM  _________________________________________________________________   (provider comments below)

## 2020-06-27 NOTE — Telephone Encounter (Signed)
   Primary Cardiologist: Dr. Bing Matter, MD  Chart reviewed as part of pre-operative protocol coverage. Given past medical history and time since last visit, based on ACC/AHA guidelines, Shawn Webb would be at acceptable risk for the planned procedure without further cardiovascular testing.   The patient was last seen 04/30/20 by Dr. Bing Matter. He was doing well from a CV standpoint. His only limitation seemed to be his back pain although he was exercising on a regular basis without any CV symptoms. He underwent a chest CT to evaluate the size of his aneurysm which was stable at 4.1 with plans to continue monitoring.   The patient was advised that if he develops new symptoms prior to surgery to contact our office to arrange for a follow-up visit, and he verbalized understanding.  I will route this recommendation to the requesting party via Epic fax function and remove from pre-op pool.  Please call with questions.  Georgie Chard, NP 06/27/2020, 4:59 PM

## 2020-07-16 ENCOUNTER — Other Ambulatory Visit: Payer: Self-pay

## 2020-07-17 ENCOUNTER — Ambulatory Visit (INDEPENDENT_AMBULATORY_CARE_PROVIDER_SITE_OTHER): Payer: BC Managed Care – PPO

## 2020-07-17 ENCOUNTER — Ambulatory Visit: Payer: BC Managed Care – PPO | Admitting: Cardiology

## 2020-07-17 ENCOUNTER — Other Ambulatory Visit: Payer: Self-pay

## 2020-07-17 ENCOUNTER — Encounter: Payer: Self-pay | Admitting: Cardiology

## 2020-07-17 VITALS — BP 138/90 | HR 58 | Ht 73.0 in | Wt 220.0 lb

## 2020-07-17 DIAGNOSIS — I7121 Aneurysm of the ascending aorta, without rupture: Secondary | ICD-10-CM

## 2020-07-17 DIAGNOSIS — I712 Thoracic aortic aneurysm, without rupture: Secondary | ICD-10-CM

## 2020-07-17 DIAGNOSIS — R001 Bradycardia, unspecified: Secondary | ICD-10-CM

## 2020-07-17 DIAGNOSIS — I10 Essential (primary) hypertension: Secondary | ICD-10-CM

## 2020-07-17 HISTORY — DX: Bradycardia, unspecified: R00.1

## 2020-07-17 NOTE — Patient Instructions (Signed)
Medication Instructions:  Your physician recommends that you continue on your current medications as directed. Please refer to the Current Medication list given to you today.  *If you need a refill on your cardiac medications before your next appointment, please call your pharmacy*   Lab Work: None If you have labs (blood work) drawn today and your tests are completely normal, you will receive your results only by: . MyChart Message (if you have MyChart) OR . A paper copy in the mail If you have any lab test that is abnormal or we need to change your treatment, we will call you to review the results.   Testing/Procedures: A zio monitor was ordered today. It will remain on for 7 days. You will then return monitor and event diary in provided box. It takes 1-2 weeks for report to be downloaded and returned to us. We will call you with the results. If monitor falls off or has orange flashing light, please call Zio for further instructions.      Follow-Up: At [facility], you and your health needs are our priority.  As part of our continuing mission to provide you with exceptional heart care, we have created designated Provider Care Teams.  These Care Teams include your primary Cardiologist (physician) and Advanced Practice Providers (APPs -  Physician Assistants and Nurse Practitioners) who all work together to provide you with the care you need, when you need it.  We recommend signing up for the patient portal called "MyChart".  Sign up information is provided on this After Visit Summary.  MyChart is used to connect with patients for Virtual Visits (Telemedicine).  Patients are able to view lab/test results, encounter notes, upcoming appointments, etc.  Non-urgent messages can be sent to your provider as well.   To learn more about what you can do with MyChart, go to [email or web address].    Your next appointment:   6 month(s)  The format for your next appointment:   In  Person  Provider:   Robert Krasowski, MD   Other Instructions    

## 2020-07-17 NOTE — Progress Notes (Signed)
Cardiology Office Note:    Date:  07/17/2020   ID:  Shawn Webb, DOB August 08, 1975, MRN [id number]  PCP:  Wanda Plump, MD  Cardiologist:  Gypsy Balsam, MD    Referring MD: Wanda Plump, MD   Chief Complaint  Patient presents with  . Clearance 08/15/2020    History of Present Illness:    Shawn Webb is a 45 y.o. male with past medical history significant for essential hypertension which seems to be somewhat difficult to control, atypical chest pain, stress test done few months ago was negative.  Also does have ascending aortic aneurysm measuring 37 mm, dyslipidemia. He comes today to my office because he required back surgery and he was sent for evaluation before the surgery from cardiac standpoint reviewed.  Overall denies of any cardiac complaints there is no chest pain tightness squeezing pressure burning chest.  Living problem with courses of back problem in spite of that he is able to be very active he walks he likes to do some tracking and have no difficulty doing it there is no shortness of breath there is no chest pain tightness squeezing pressure burning chest, basically no cardiac symptoms.  Interestingly today is EKG showed bradycardia at rate of 49 which I suspect is secondary to high vagal tone because he is fairly athletic.  But I will ask him to wear monitor for 7 days make sure there is no significant bradycardia.  There is no dizziness no syncope.  Past Medical History:  Diagnosis Date  . Allergic rhinitis 11/21/2011  . Allergy   . Annual physical exam 11/21/2011  . Ascending aortic aneurysm (HCC) 11/29/2019  . Chest pain of uncertain etiology 08/17/2019  . Chicken pox   . Dyslipidemia 08/17/2019  . Essential hypertension 08/17/2019  . Hypertension   . PCP NOTES >>>> 03/05/2015    Past Surgical History:  Procedure Laterality Date  . NO PAST SURGERIES      Current Medications: Current Meds  Medication Sig  . amLODipine (NORVASC) 5 MG tablet Take 1 tablet (5 mg  total) by mouth daily.  . cetirizine (ZYRTEC) 5 MG tablet Take 5 mg by mouth daily.  [name] Kitchen losartan (COZAAR) 50 MG tablet Take 1 tablet (50 mg total) by mouth daily.  . pregabalin (LYRICA) 75 MG capsule Take 75 mg by mouth daily.     Allergies:   Sulfa antibiotics   Social History   Socioeconomic History  . Marital status: Married    Spouse name: Not on file  . Number of children: 2  . Years of education: Not on file  . Highest education level: Not on file  Occupational History  . Occupation: Runner, broadcasting/film/video, [employer], [employer]  Tobacco Use  . Smoking status: Never Smoker  . Smokeless tobacco: Never Used  Substance and Sexual Activity  . Alcohol use: Yes    Comment: socially   . Drug use: No  . Sexual activity: Not on file  Other Topics Concern  . Not on file  Social History Narrative   Children: 2011, 2016    Plays outdoor tennis, hiker.    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 There is no history of CAD, Diabetes, or Prostate cancer. ROS:   Please see the history of present illness.    All 14 point review of systems negative except as described per history of present illness  EKGs/Labs/Other Studies Reviewed:      Recent Labs: 08/12/2019: ALT 35; Hemoglobin 14.1; Platelets 128.0 02/01/2020: BUN 15; Creat 1.16; Potassium 4.1; Sodium 140  Recent Lipid Panel    Component Value Date/Time   CHOL 196 06/17/2018 0917   TRIG 190.0 (H) 06/17/2018 0917   HDL 36.70 (L) 06/17/2018 0917   CHOLHDL 5 06/17/2018 0917   VLDL 38.0 06/17/2018 0917   LDLCALC 121 (H) 06/17/2018 0917   LDLDIRECT 160.0 05/05/2016 1554     Physical Exam:    VS:  BP 138/90 (BP Location: Left Arm, Patient Position: Sitting)   Pulse (!) 58   Ht 6\' 1"  (1.854 m)   Wt 220 lb (99.8 kg)   SpO2 99%   BMI 29.03 kg/m     Wt Readings from Last 3 Encounters:  07/17/20 220 lb (99.8 kg)  04/30/20 212 lb (96.2 kg)  02/01/20 215 lb 6 oz (97.7 kg)     GEN:  Well nourished, well developed in no acute distress HEENT: Normal NECK: No JVD; No carotid bruits LYMPHATICS: No lymphadenopathy CARDIAC: RRR, no murmurs, no rubs, no gallops RESPIRATORY:  Clear to auscultation without rales, wheezing or rhonchi  ABDOMEN: Soft, non-tender, non-distended MUSCULOSKELETAL:  No edema; No deformity  SKIN: Warm and dry LOWER EXTREMITIES: no swelling NEUROLOGIC:  Alert and oriented x 3 PSYCHIATRIC:  Normal affect   ASSESSMENT:    1. Essential hypertension   2. Bradycardia   3. Ascending aortic aneurysm (HCC)    PLAN:    In order of problems listed above:  1. Essential hypertension ask him to double the dose of amlodipine to 10 mg daily we will continue rest of the medication. 2. Bradycardia with maintenance of sinus.  I will ask him to wear monitor for 7 days make sure there is no significant problems I will make sure he is ready for surgery. 3. Ascending aortic aneurysm measuring 37 mm.  His blood pressure control. 4. Dyslipidemia he is scheduled to have his annual physical in May.  I did review his K PN from before LDL is 121 HDL 36.7.  Overall risks is selectively low.  Therefore, I will continue monitoring right now and will wait for results of his fasting lipid profile that will be done in next few weeks. 5. Cardiovascular preop evaluation.  Overall he is low risk for surgery from cardiac standpoint of view.  In spite of his hypertension bradycardia he got very good exercise tolerance.  There is no symptoms that would indicate cardiac issues.   Medication Adjustments/Labs and Tests Ordered: Current medicines are reviewed at length  with the patient today.  Concerns regarding medicines are outlined above.  No orders of the defined types were placed in this encounter.  Medication changes: No orders of the defined types were placed in this encounter.   Signed, June, MD, [facility] 07/17/2020 11:38 AM    [facility]

## 2020-07-23 ENCOUNTER — Telehealth: Payer: Self-pay | Admitting: Cardiology

## 2020-07-23 NOTE — Telephone Encounter (Signed)
Lets wait for results of monitor

## 2020-07-23 NOTE — Telephone Encounter (Signed)
Called patient, no  answer; left message for patient to return call.

## 2020-07-23 NOTE — Telephone Encounter (Signed)
Called patient informed him that Dr. Bing Matter wants to wait until we get results from monitor to advise. He understood. He will call us if things get worse before then.   Advised patient to keep a log of his blood pressure daily and if they are low to let us know. Reminded him to also stay hydrated.

## 2020-07-23 NOTE — Telephone Encounter (Signed)
Patient was returning phone call 

## 2020-07-23 NOTE — Telephone Encounter (Signed)
STAT if patient feels like he/she is going to faint   1) Are you dizzy now? no  2) Do you feel faint or have you passed out? no  3) Do you have any other symptoms? Patient states it is more of a lightheaded feeling.   4) Have you checked your HR and BP (record if available)?  141/91 HR 51  Patient had more dizziness yesterday. When he was in the office he told Dr. Kirtland Bouchard that he was not having any dizziness but since he has been in the office he had some instances. He is not sure if the dizziness is heart related or if it has to do with the herniated disc in his back. He is also on some medication for his BP and is not sure if that is causing the dizziness  He will be mailing back his heart monitor tomorrow as well

## 2020-07-23 NOTE — Telephone Encounter (Signed)
See previous encounter

## 2020-07-24 ENCOUNTER — Encounter (HOSPITAL_BASED_OUTPATIENT_CLINIC_OR_DEPARTMENT_OTHER): Payer: BC Managed Care – PPO | Admitting: Cardiovascular Disease

## 2020-07-26 ENCOUNTER — Encounter: Payer: Self-pay | Admitting: Internal Medicine

## 2020-07-26 ENCOUNTER — Ambulatory Visit: Payer: BC Managed Care – PPO | Admitting: Internal Medicine

## 2020-07-26 ENCOUNTER — Other Ambulatory Visit: Payer: Self-pay

## 2020-07-26 VITALS — BP 162/84 | HR 58 | Temp 98.0°F | Resp 16 | Ht 73.0 in | Wt 220.1 lb

## 2020-07-26 DIAGNOSIS — I10 Essential (primary) hypertension: Secondary | ICD-10-CM

## 2020-07-26 DIAGNOSIS — F419 Anxiety disorder, unspecified: Secondary | ICD-10-CM

## 2020-07-26 HISTORY — DX: Anxiety disorder, unspecified: F41.9

## 2020-07-26 MED ORDER — ESCITALOPRAM OXALATE 10 MG PO TABS: 10.0000 mg | ORAL_TABLET | Freq: Every day | ORAL | 3 refills | Status: DC

## 2020-07-26 NOTE — Patient Instructions (Addendum)
Happy early [place] Ouch and good luck with your surgery!   Start escitalopram, take it at night.  It may take few days or 2 to 3 weeks to work  Increase amlodipine 5 mg: 2 tablets daily. Continue checking your blood pressure. If that is working well we can send you a refill. Check the  blood pressure   BP GOAL is between 110/65 and  135/85. If it is consistently higher or lower, let me know   See you in May

## 2020-07-26 NOTE — Progress Notes (Signed)
Subjective:    Patient ID: Shawn Webb, male    DOB: 12/01/1975, 45 y.o.   MRN: [id number]  DOS:  07/26/2020 Type of visit - description: Acute Chief complaint is anxiety. The patient is scheduled to have surgery few weeks, he is very anxious about it. His mother and wife believe he needs medication.  He is not convinced.  Denies depression.  Denies hyper spending, no insomnia. Sleeping okay.  HTN: When checked at home is in the 140s/80s.   BP Readings from Last 3 Encounters:  07/26/20 (!) 162/84  07/17/20 138/90  04/30/20 (!) 160/100   Review of Systems See above   Past Medical History:  Diagnosis Date  . Allergic rhinitis 11/21/2011  . Allergy   . Annual physical exam 11/21/2011  . Ascending aortic aneurysm (HCC) 11/29/2019  . Chest pain of uncertain etiology 08/17/2019  . Chicken pox   . Dyslipidemia 08/17/2019  . Essential hypertension 08/17/2019  . Hypertension   . PCP NOTES >>>> 03/05/2015    Past Surgical History:  Procedure Laterality Date  . NO PAST SURGERIES      Allergies as of 07/26/2020      Reactions   Sulfa Antibiotics Other (See Comments)   Unknown      Medication List       Accurate as of July 26, 2020  1:43 PM. If you have any questions, ask your nurse or doctor.        amLODipine 5 MG tablet Commonly known as: NORVASC Take 2 tablets (10 mg total) by mouth daily. What changed: how much to take Changed by: Willow Ora, MD   cetirizine 5 MG tablet Commonly known as: ZYRTEC Take 5 mg by mouth daily.   escitalopram 10 MG tablet Commonly known as: LEXAPRO Take 1 tablet (10 mg total) by mouth daily. Started by: Willow Ora, MD   ibuprofen 200 MG tablet Commonly known as: ADVIL Take 200 mg by mouth every 6 (six) hours as needed.   losartan 50 MG tablet Commonly known as: COZAAR Take 1 tablet (50 mg total) by mouth daily.   pregabalin 75 MG capsule Commonly known as: LYRICA Take 75 mg by mouth daily.          Objective:    Physical Exam BP (!) 162/84 (BP Location: Left Arm, Patient Position: Standing, Cuff Size: Normal)   Pulse (!) 58   Temp 98 F (36.7 C) (Oral)   Resp 16   Ht 6\' 1"  (1.854 m)   Wt 220 lb 2 oz (99.8 kg)   SpO2 98%   BMI 29.04 kg/m  General:   Well developed, NAD, BMI noted. HEENT:  Normocephalic . Face symmetric, atraumatic Skin: Not pale. Not jaundice Neurologic:  alert & oriented X3.  Speech normal, gait is normal, she is standing most of the time, states that when he sits down back pain is worse Psych--  Cognition and judgment appear intact.  Cooperative with normal attention span and concentration.  Behavior appropriate. Noted to be anxious but not depressed.  Very talkative.     Assessment       Assessment HTN Blood donor (q 3 months) Mild thrombocytopenia Allergic rhinitis Rosacea: face Eczema (elbows) Ascending aortic dilatation, chest pain: (-) Stress test 10-2019.  Repeat CT chest 05/2020   PLAN: Anxiety: I do believe the patient has anxiety, he is very talkative today, he denies episodes of depression/hyper spending/insomnia. Doubt  bipolar but we need to watch that carefully. We discussed observation versus  medication, elected medication, start Lexapro, aware of side effects, recommend to call if that is not working otherwise I can see him in May as a schedule.   HTN Seen by cardiology 07/17/2020, they rec to increase amlodipine to 10 mg, he has not done that, rec to do increase it,  monitor BPs, continue losartan, see AVS. Ascending aorta dilatation, chest pain: Saw cardiology, they rec  7-day monitor to check for bradycardia (results pending). Was felt to be low risk for future surgery. Back pain: Schedule for L5-S1 microdiscectomy 08/15/2020 RTC 08/2020    This visit occurred during the SARS-CoV-2 public health emergency.  Safety protocols were in place, including screening questions prior to the visit, additional usage of staff PPE, and extensive cleaning of  exam room while observing appropriate contact time as indicated for disinfecting solutions.

## 2020-07-26 NOTE — Assessment & Plan Note (Signed)
Anxiety: I do believe the patient has anxiety, he is very talkative today, he denies episodes of depression/hyper spending/insomnia. Doubt  bipolar but we need to watch that carefully. We discussed observation versus medication, elected medication, start Lexapro, aware of side effects, recommend to call if that is not working otherwise I can see him in May as a schedule.   HTN Seen by cardiology 07/17/2020, they rec to increase amlodipine to 10 mg, he has not done that, rec to do increase it,  monitor BPs, continue losartan, see AVS. Ascending aorta dilatation, chest pain: Saw cardiology, they rec  7-day monitor to check for bradycardia (results pending). Was felt to be low risk for future surgery. Back pain: Schedule for L5-S1 microdiscectomy 08/15/2020 RTC 08/2020

## 2020-08-14 ENCOUNTER — Encounter (HOSPITAL_COMMUNITY): Payer: Self-pay | Admitting: Orthopedic Surgery

## 2020-08-14 ENCOUNTER — Other Ambulatory Visit (HOSPITAL_COMMUNITY)
Admission: RE | Admit: 2020-08-14 | Discharge: 2020-08-14 | Disposition: A | Discharge status: Home / Self Care | Payer: BC Managed Care – PPO | Source: Ambulatory Visit | Attending: Orthopedic Surgery | Admitting: Orthopedic Surgery

## 2020-08-14 ENCOUNTER — Other Ambulatory Visit: Payer: Self-pay

## 2020-08-14 DIAGNOSIS — Z01812 Encounter for preprocedural laboratory examination: Secondary | ICD-10-CM | POA: Insufficient documentation

## 2020-08-14 DIAGNOSIS — Z20822 Contact with and (suspected) exposure to covid-19: Secondary | ICD-10-CM | POA: Insufficient documentation

## 2020-08-14 LAB — SARS CORONAVIRUS 2 (TAT 6-24 HRS): SARS Coronavirus 2: NEGATIVE

## 2020-08-14 NOTE — Progress Notes (Signed)
PCP - Dr. Drue Novel  Cardiologist - Dr. Bing Matter   Chest x-ray -  EKG - 07/17/20 Stress Test - 2021 ECHO - denies  Cardiac Cath - denies  ERAS Protcol - yes   COVID TEST- 08/14/20 pending result   Anesthesia review: n/a  -------------  SDW INSTRUCTIONS:  Your procedure is scheduled on 08/15/20. Please report to [facility] Main Entrance "A" at 1020 A.M., and check in at the Admitting office. Call this number if you have problems the morning of surgery: [phone]   Remember: Do not eat after midnight the night before your surgery  You may drink clear liquids until 0950 AM the morning of your surgery.   Clear liquids allowed are: Water, Non-Citrus Juices (without pulp), Carbonated Beverages, Clear Tea, Black Coffee Only, and Gatorade   Medications to take morning of surgery with a sip of water include: cetirizine (ZYRTEC)   As of today, STOP taking any Aspirin (unless otherwise instructed by your surgeon), Aleve, Naproxen, Ibuprofen, Motrin, Advil, Goody's, BC's, all herbal medications, fish oil, and all vitamins.    The Morning of Surgery Do not wear jewelry Do not wear lotions, powders, or colognes, or deodorant Men may shave face and neck. Do not bring valuables to the hospital. [facility] is not responsible for any belongings or valuables. If you are a smoker, DO NOT Smoke 24 hours prior to surgery If you wear a CPAP at night please bring your mask the morning of surgery  Remember that you must have someone to transport you home after your surgery, and remain with you for 24 hours if you are discharged the same day. Please bring cases for contacts, glasses, hearing aids, dentures or bridgework because it cannot be worn into surgery.   Patients discharged the day of surgery will not be allowed to drive home.   Please shower the NIGHT BEFORE SURGERY and the MORNING OF SURGERY with DIAL Soap. Wear comfortable clothes the morning of surgery. Oral Hygiene is also important to  reduce your risk of infection.  Remember - BRUSH YOUR TEETH THE MORNING OF SURGERY WITH YOUR REGULAR TOOTHPASTE  Patient denies shortness of breath, fever, cough and chest pain.

## 2020-08-15 ENCOUNTER — Encounter (HOSPITAL_COMMUNITY): Payer: Self-pay | Admitting: Orthopedic Surgery

## 2020-08-15 ENCOUNTER — Ambulatory Visit (HOSPITAL_COMMUNITY): Payer: BC Managed Care – PPO

## 2020-08-15 ENCOUNTER — Other Ambulatory Visit: Payer: Self-pay

## 2020-08-15 ENCOUNTER — Ambulatory Visit (HOSPITAL_COMMUNITY)
Admission: RE | Disposition: A | Discharge status: Home / Self Care | Payer: Self-pay | Source: Home / Self Care | Attending: Orthopedic Surgery

## 2020-08-15 ENCOUNTER — Ambulatory Visit (HOSPITAL_COMMUNITY): Payer: BC Managed Care – PPO | Admitting: Certified Registered Nurse Anesthetist

## 2020-08-15 ENCOUNTER — Ambulatory Visit (HOSPITAL_COMMUNITY)
Admission: RE | Admit: 2020-08-15 | Discharge: 2020-08-15 | Disposition: A | Discharge status: Home / Self Care | Payer: BC Managed Care – PPO | Attending: Orthopedic Surgery | Admitting: Orthopedic Surgery

## 2020-08-15 DIAGNOSIS — M5117 Intervertebral disc disorders with radiculopathy, lumbosacral region: Secondary | ICD-10-CM | POA: Insufficient documentation

## 2020-08-15 DIAGNOSIS — Z882 Allergy status to sulfonamides status: Secondary | ICD-10-CM | POA: Diagnosis not present

## 2020-08-15 DIAGNOSIS — Z79899 Other long term (current) drug therapy: Secondary | ICD-10-CM | POA: Diagnosis not present

## 2020-08-15 DIAGNOSIS — I10 Essential (primary) hypertension: Secondary | ICD-10-CM | POA: Insufficient documentation

## 2020-08-15 DIAGNOSIS — Z791 Long term (current) use of non-steroidal anti-inflammatories (NSAID): Secondary | ICD-10-CM | POA: Diagnosis not present

## 2020-08-15 DIAGNOSIS — M5410 Radiculopathy, site unspecified: Secondary | ICD-10-CM

## 2020-08-15 DIAGNOSIS — Z800 Family history of malignant neoplasm of digestive organs: Secondary | ICD-10-CM | POA: Diagnosis not present

## 2020-08-15 HISTORY — PX: LUMBAR LAMINECTOMY/DECOMPRESSION MICRODISCECTOMY: SHX5026

## 2020-08-15 LAB — COMPREHENSIVE METABOLIC PANEL
ALT: 30 U/L (ref 0–44)
AST: 60 U/L — ABNORMAL HIGH (ref 15–41)
Albumin: 4.5 g/dL (ref 3.5–5.0)
Alkaline Phosphatase: 93 U/L (ref 38–126)
Anion gap: 10 (ref 5–15)
BUN: 13 mg/dL (ref 6–20)
CO2: 22 mmol/L (ref 22–32)
Calcium: 9.2 mg/dL (ref 8.9–10.3)
Chloride: 105 mmol/L (ref 98–111)
Creatinine, Ser: 1.04 mg/dL (ref 0.61–1.24)
GFR, Estimated: >60 mL/min (ref >60)
Glucose, Bld: 101 mg/dL — ABNORMAL HIGH (ref 70–99)
Potassium: 6.2 mmol/L — ABNORMAL HIGH (ref 3.5–5.1)
Sodium: 137 mmol/L (ref 135–145)
Total Bilirubin: 2 mg/dL — ABNORMAL HIGH (ref 0.3–1.2)
Total Protein: 6.9 g/dL (ref 6.5–8.1)

## 2020-08-15 LAB — TYPE AND SCREEN
ABO/RH(D): A NEG
Antibody Screen: NEGATIVE

## 2020-08-15 LAB — CBC WITH DIFFERENTIAL/PLATELET
Abs Immature Granulocytes: 0.01 10*3/uL (ref 0.00–0.07)
Basophils Absolute: 0 10*3/uL (ref 0.0–0.1)
Basophils Relative: 0 %
Eosinophils Absolute: 0.2 10*3/uL (ref 0.0–0.5)
Eosinophils Relative: 3 %
HCT: 49.9 % (ref 39.0–52.0)
Hemoglobin: 16.6 g/dL (ref 13.0–17.0)
Immature Granulocytes: 0 %
Lymphocytes Relative: 24 %
Lymphs Abs: 1.4 10*3/uL (ref 0.7–4.0)
MCH: 30.1 pg (ref 26.0–34.0)
MCHC: 33.3 g/dL (ref 30.0–36.0)
MCV: 90.6 fL (ref 80.0–100.0)
Monocytes Absolute: 0.4 10*3/uL (ref 0.1–1.0)
Monocytes Relative: 7 %
Neutro Abs: 3.9 10*3/uL (ref 1.7–7.7)
Neutrophils Relative %: 66 %
Platelets: 123 10*3/uL — ABNORMAL LOW (ref 150–400)
RBC: 5.51 MIL/uL (ref 4.22–5.81)
RDW: 12.6 % (ref 11.5–15.5)
WBC: 6 10*3/uL (ref 4.0–10.5)
nRBC: 0 % (ref 0.0–0.2)

## 2020-08-15 LAB — URINALYSIS, ROUTINE W REFLEX MICROSCOPIC
Bilirubin Urine: NEGATIVE
Glucose, UA: NEGATIVE mg/dL
Hgb urine dipstick: NEGATIVE
Ketones, ur: NEGATIVE mg/dL
Leukocytes,Ua: NEGATIVE
Nitrite: NEGATIVE
Protein, ur: NEGATIVE mg/dL
Specific Gravity, Urine: 1.005 (ref 1.005–1.030)
pH: 9 — ABNORMAL HIGH (ref 5.0–8.0)

## 2020-08-15 LAB — PROTIME-INR
INR: 1 (ref 0.8–1.2)
Prothrombin Time: 13.6 seconds (ref 11.4–15.2)

## 2020-08-15 LAB — APTT: aPTT: 26 seconds (ref 24–36)

## 2020-08-15 LAB — SURGICAL PCR SCREEN
MRSA, PCR: NEGATIVE
Staphylococcus aureus: NEGATIVE

## 2020-08-15 LAB — ABO/RH: ABO/RH(D): A NEG

## 2020-08-15 SURGERY — LUMBAR LAMINECTOMY/DECOMPRESSION MICRODISCECTOMY
Anesthesia: General | Laterality: Left

## 2020-08-15 MED ORDER — BUPIVACAINE LIPOSOME 1.3 % IJ SUSP
INTRAMUSCULAR | Status: AC
  Filled 2020-08-15: qty 20

## 2020-08-15 MED ORDER — FENTANYL CITRATE (PF) 250 MCG/5ML IJ SOLN
INTRAMUSCULAR | Status: DC | PRN
  Administered 2020-08-15: 100 ug via INTRAVENOUS

## 2020-08-15 MED ORDER — METHYLPREDNISOLONE ACETATE 40 MG/ML IJ SUSP
INTRAMUSCULAR | Status: AC
  Filled 2020-08-15: qty 1

## 2020-08-15 MED ORDER — HYDROCODONE-ACETAMINOPHEN 7.5-325 MG PO TABS: 1.0000 | ORAL_TABLET | Freq: Four times a day (QID) | ORAL | 0 refills | Status: DC | PRN

## 2020-08-15 MED ORDER — METHYLPREDNISOLONE ACETATE 40 MG/ML IJ SUSP
INTRAMUSCULAR | Status: DC | PRN
  Administered 2020-08-15: 40 mg

## 2020-08-15 MED ORDER — EPHEDRINE 5 MG/ML INJ
INTRAVENOUS | Status: AC
  Filled 2020-08-15: qty 10

## 2020-08-15 MED ORDER — EPHEDRINE SULFATE-NACL 50-0.9 MG/10ML-% IV SOSY
PREFILLED_SYRINGE | INTRAVENOUS | Status: DC | PRN
  Administered 2020-08-15: 10 mg via INTRAVENOUS

## 2020-08-15 MED ORDER — PROMETHAZINE HCL 25 MG/ML IJ SOLN: 6.2500 mg | INTRAMUSCULAR | Status: DC | PRN

## 2020-08-15 MED ORDER — ROCURONIUM BROMIDE 10 MG/ML (PF) SYRINGE
PREFILLED_SYRINGE | INTRAVENOUS | Status: DC | PRN
  Administered 2020-08-15: 70 mg via INTRAVENOUS
  Administered 2020-08-15: 10 mg via INTRAVENOUS

## 2020-08-15 MED ORDER — CHLORHEXIDINE GLUCONATE 0.12 % MT SOLN
15.0000 mL | Freq: Once | OROMUCOSAL | Status: AC
  Administered 2020-08-15: 15 mL via OROMUCOSAL
  Filled 2020-08-15: qty 15

## 2020-08-15 MED ORDER — PHENYLEPHRINE HCL-NACL 10-0.9 MG/250ML-% IV SOLN
INTRAVENOUS | Status: DC | PRN
  Administered 2020-08-15: 25 ug/min via INTRAVENOUS

## 2020-08-15 MED ORDER — METHYLENE BLUE 0.5 % INJ SOLN
INTRAVENOUS | Status: AC
  Filled 2020-08-15: qty 10

## 2020-08-15 MED ORDER — ORAL CARE MOUTH RINSE: 15.0000 mL | Freq: Once | OROMUCOSAL | Status: AC

## 2020-08-15 MED ORDER — LACTATED RINGERS IV SOLN: INTRAVENOUS | Status: DC | PRN

## 2020-08-15 MED ORDER — ONDANSETRON HCL 4 MG/2ML IJ SOLN
INTRAMUSCULAR | Status: AC
  Filled 2020-08-15: qty 2

## 2020-08-15 MED ORDER — METHYLENE BLUE 0.5 % INJ SOLN
INTRAVENOUS | Status: DC | PRN
  Administered 2020-08-15: 1 mL via SUBMUCOSAL

## 2020-08-15 MED ORDER — BUPIVACAINE-EPINEPHRINE 0.25% -1:200000 IJ SOLN
INTRAMUSCULAR | Status: DC | PRN
  Administered 2020-08-15: 50 mL

## 2020-08-15 MED ORDER — LIDOCAINE 2% (20 MG/ML) 5 ML SYRINGE
INTRAMUSCULAR | Status: AC
  Filled 2020-08-15: qty 5

## 2020-08-15 MED ORDER — FENTANYL CITRATE (PF) 100 MCG/2ML IJ SOLN: 25.0000 ug | INTRAMUSCULAR | Status: DC | PRN

## 2020-08-15 MED ORDER — METHOCARBAMOL 500 MG PO TABS: 500.0000 mg | ORAL_TABLET | Freq: Four times a day (QID) | ORAL | 0 refills | Status: DC | PRN

## 2020-08-15 MED ORDER — FENTANYL CITRATE (PF) 250 MCG/5ML IJ SOLN
INTRAMUSCULAR | Status: AC
  Filled 2020-08-15: qty 5

## 2020-08-15 MED ORDER — ACETAMINOPHEN 500 MG PO TABS
1000.0000 mg | ORAL_TABLET | Freq: Once | ORAL | Status: AC
  Administered 2020-08-15: 1000 mg via ORAL
  Filled 2020-08-15: qty 2

## 2020-08-15 MED ORDER — BUPIVACAINE-EPINEPHRINE (PF) 0.25% -1:200000 IJ SOLN
INTRAMUSCULAR | Status: AC
  Filled 2020-08-15: qty 30

## 2020-08-15 MED ORDER — PROPOFOL 10 MG/ML IV BOLUS
INTRAVENOUS | Status: AC
  Filled 2020-08-15: qty 20

## 2020-08-15 MED ORDER — ROCURONIUM BROMIDE 10 MG/ML (PF) SYRINGE
PREFILLED_SYRINGE | INTRAVENOUS | Status: AC
  Filled 2020-08-15: qty 10

## 2020-08-15 MED ORDER — THROMBIN (RECOMBINANT) 20000 UNITS EX SOLR
CUTANEOUS | Status: AC
  Filled 2020-08-15: qty 20000

## 2020-08-15 MED ORDER — DIPHENHYDRAMINE HCL 50 MG/ML IJ SOLN
INTRAMUSCULAR | Status: AC
  Filled 2020-08-15: qty 1

## 2020-08-15 MED ORDER — CEFAZOLIN SODIUM-DEXTROSE 2-4 GM/100ML-% IV SOLN
2.0000 g | INTRAVENOUS | Status: AC
  Administered 2020-08-15: 2 g via INTRAVENOUS
  Filled 2020-08-15: qty 100

## 2020-08-15 MED ORDER — MIDAZOLAM HCL 2 MG/2ML IJ SOLN
INTRAMUSCULAR | Status: DC | PRN
  Administered 2020-08-15: 2 mg via INTRAVENOUS

## 2020-08-15 MED ORDER — DIPHENHYDRAMINE HCL 50 MG/ML IJ SOLN
INTRAMUSCULAR | Status: DC | PRN
  Administered 2020-08-15: 12.5 mg via INTRAVENOUS

## 2020-08-15 MED ORDER — LIDOCAINE 2% (20 MG/ML) 5 ML SYRINGE
INTRAMUSCULAR | Status: DC | PRN
  Administered 2020-08-15: 100 mg via INTRAVENOUS

## 2020-08-15 MED ORDER — MIDAZOLAM HCL 2 MG/2ML IJ SOLN
INTRAMUSCULAR | Status: AC
  Filled 2020-08-15: qty 2

## 2020-08-15 MED ORDER — HEMOSTATIC AGENTS (NO CHARGE) OPTIME
TOPICAL | Status: DC | PRN
  Administered 2020-08-15: 1 via TOPICAL

## 2020-08-15 MED ORDER — POVIDONE-IODINE 7.5 % EX SOLN
Freq: Once | CUTANEOUS | Status: DC
  Filled 2020-08-15: qty 118

## 2020-08-15 MED ORDER — LACTATED RINGERS IV SOLN: INTRAVENOUS | Status: DC

## 2020-08-15 MED ORDER — PROPOFOL 10 MG/ML IV BOLUS
INTRAVENOUS | Status: DC | PRN
  Administered 2020-08-15: 170 mg via INTRAVENOUS

## 2020-08-15 MED ORDER — SUGAMMADEX SODIUM 200 MG/2ML IV SOLN
INTRAVENOUS | Status: DC | PRN
  Administered 2020-08-15: 200 mg via INTRAVENOUS

## 2020-08-15 SURGICAL SUPPLY — 72 items
BENZOIN TINCTURE PRP APPL 2/3 (GAUZE/BANDAGES/DRESSINGS) IMPLANT
BNDG GAUZE ELAST 4 BULKY (GAUZE/BANDAGES/DRESSINGS) ×3 IMPLANT
BUR ROUND PRECISION 4.0 (BURR) ×2 IMPLANT
BUR ROUND PRECISION 4.0MM (BURR) ×1
CABLE BIPOLOR RESECTION CORD (MISCELLANEOUS) ×3 IMPLANT
CANISTER SUCT 3000ML PPV (MISCELLANEOUS) ×3 IMPLANT
CARTRIDGE OIL MAESTRO DRILL (MISCELLANEOUS) ×1 IMPLANT
CLOSURE WOUND 1/2 X4 (GAUZE/BANDAGES/DRESSINGS)
COVER SURGICAL LIGHT HANDLE (MISCELLANEOUS) ×3 IMPLANT
COVER WAND RF STERILE (DRAPES) ×3 IMPLANT
DIFFUSER DRILL AIR PNEUMATIC (MISCELLANEOUS) ×3 IMPLANT
DRAIN CHANNEL 15F RND FF W/TCR (WOUND CARE) IMPLANT
DRAPE POUCH INSTRU U-SHP 10X18 (DRAPES) ×6 IMPLANT
DRAPE SURG 17X23 STRL (DRAPES) ×12 IMPLANT
DURAPREP 26ML APPLICATOR (WOUND CARE) ×3 IMPLANT
ELECT BLADE 4.0 EZ CLEAN MEGAD (MISCELLANEOUS) ×3
ELECT CAUTERY BLADE 6.4 (BLADE) ×3 IMPLANT
ELECT REM PT RETURN 9FT ADLT (ELECTROSURGICAL) ×3
ELECTRODE BLDE 4.0 EZ CLN MEGD (MISCELLANEOUS) ×1 IMPLANT
ELECTRODE REM PT RTRN 9FT ADLT (ELECTROSURGICAL) ×1 IMPLANT
EVACUATOR SILICONE 100CC (DRAIN) IMPLANT
FILTER STRAW FLUID ASPIR (MISCELLANEOUS) ×6 IMPLANT
GAUZE 4X4 16PLY RFD (DISPOSABLE) ×6 IMPLANT
GAUZE SPONGE 4X4 12PLY STRL (GAUZE/BANDAGES/DRESSINGS) ×3 IMPLANT
GLOVE BIO SURGEON STRL SZ7 (GLOVE) ×3 IMPLANT
GLOVE BIO SURGEON STRL SZ8 (GLOVE) ×3 IMPLANT
GLOVE SRG 8 PF TXTR STRL LF DI (GLOVE) ×1 IMPLANT
GLOVE SURG UNDER POLY LF SZ7 (GLOVE) ×3 IMPLANT
GLOVE SURG UNDER POLY LF SZ8 (GLOVE) ×2
GOWN STRL REUS W/ TWL LRG LVL3 (GOWN DISPOSABLE) ×1 IMPLANT
GOWN STRL REUS W/ TWL XL LVL3 (GOWN DISPOSABLE) ×2 IMPLANT
GOWN STRL REUS W/TWL LRG LVL3 (GOWN DISPOSABLE) ×2
GOWN STRL REUS W/TWL XL LVL3 (GOWN DISPOSABLE) ×4
IV CATH 14GX2 1/4 (CATHETERS) ×3 IMPLANT
KIT BASIN OR (CUSTOM PROCEDURE TRAY) ×3 IMPLANT
KIT POSITION SURG JACKSON T1 (MISCELLANEOUS) ×3 IMPLANT
KIT TURNOVER KIT B (KITS) ×3 IMPLANT
MARKER SKIN DUAL TIP RULER LAB (MISCELLANEOUS) ×6 IMPLANT
NEEDLE 18GX1X1/2 (RX/OR ONLY) (NEEDLE) ×3 IMPLANT
NEEDLE 22X1 1/2 (OR ONLY) (NEEDLE) ×3 IMPLANT
NEEDLE HYPO 25GX1X1/2 BEV (NEEDLE) ×3 IMPLANT
NEEDLE SPNL 18GX3.5 QUINCKE PK (NEEDLE) ×6 IMPLANT
NS IRRIG 1000ML POUR BTL (IV SOLUTION) ×3 IMPLANT
OIL CARTRIDGE MAESTRO DRILL (MISCELLANEOUS) ×3
PACK LAMINECTOMY ORTHO (CUSTOM PROCEDURE TRAY) ×3 IMPLANT
PACK UNIVERSAL I (CUSTOM PROCEDURE TRAY) ×3 IMPLANT
PAD ARMBOARD 7.5X6 YLW CONV (MISCELLANEOUS) ×6 IMPLANT
PATTIES SURGICAL .5 X.5 (GAUZE/BANDAGES/DRESSINGS) IMPLANT
PATTIES SURGICAL .5 X1 (DISPOSABLE) ×3 IMPLANT
SLEEVE SCD COMPRESS KNEE MED (STOCKING) ×3 IMPLANT
SOL ANTI FOG 6CC (MISCELLANEOUS) ×1 IMPLANT
SOLUTION ANTI FOG 6CC (MISCELLANEOUS) ×2
SPONGE INTESTINAL PEANUT (DISPOSABLE) ×3 IMPLANT
SPONGE SURGIFOAM ABS GEL SZ50 (HEMOSTASIS) ×3 IMPLANT
STRIP CLOSURE SKIN 1/2X4 (GAUZE/BANDAGES/DRESSINGS) IMPLANT
SURGIFLO W/THROMBIN 8M KIT (HEMOSTASIS) ×3 IMPLANT
SUT MNCRL AB 4-0 PS2 18 (SUTURE) ×3 IMPLANT
SUT VIC AB 0 CT1 18XCR BRD 8 (SUTURE) ×1 IMPLANT
SUT VIC AB 0 CT1 8-18 (SUTURE) ×2
SUT VIC AB 1 CT1 18XCR BRD 8 (SUTURE) ×1 IMPLANT
SUT VIC AB 1 CT1 8-18 (SUTURE) ×2
SUT VIC AB 2-0 CT2 18 VCP726D (SUTURE) ×3 IMPLANT
SYR 20ML LL LF (SYRINGE) ×3 IMPLANT
SYR BULB IRRIG 60ML STRL (SYRINGE) ×3 IMPLANT
SYR CONTROL 10ML LL (SYRINGE) ×6 IMPLANT
SYR TB 1ML 25GX5/8 (SYRINGE) ×6 IMPLANT
SYR TB 1ML LUER SLIP (SYRINGE) ×6 IMPLANT
TAPE UMBILICAL COTTON 1/8X30 (MISCELLANEOUS) ×3 IMPLANT
TOWEL GREEN STERILE (TOWEL DISPOSABLE) ×3 IMPLANT
TOWEL GREEN STERILE FF (TOWEL DISPOSABLE) ×3 IMPLANT
WATER STERILE IRR 1000ML POUR (IV SOLUTION) ×3 IMPLANT
YANKAUER SUCT BULB TIP NO VENT (SUCTIONS) ×3 IMPLANT

## 2020-08-15 NOTE — Anesthesia Preprocedure Evaluation (Signed)
Anesthesia Evaluation  Patient identified by MRN, date of birth, ID band Patient awake    Reviewed: Allergy & Precautions, NPO status , Patient's Chart, lab work & pertinent test results  Airway Mallampati: II  TM Distance: >3 FB Neck ROM: Full    Dental  (+) Teeth Intact, Dental Advisory Given   Pulmonary neg pulmonary ROS,    Pulmonary exam normal breath sounds clear to auscultation       Cardiovascular hypertension, Pt. 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  Reproductive/Obstetrics                             Anesthesia Physical Anesthesia Plan  ASA: II  Anesthesia Plan: General   Post-op Pain Management:    Induction: Intravenous  PONV Risk Score and Plan: 3 and Midazolam, Ondansetron and Diphenhydramine  Airway Management Planned: Oral ETT  Additional Equipment:   Intra-op Plan:   Post-operative Plan: Extubation in OR  Informed Consent: I have reviewed the patients History and Physical, chart, labs and discussed the procedure including the risks, benefits and alternatives for the proposed anesthesia with the patient or authorized representative who has indicated his/her understanding and acceptance.     Dental advisory given  Plan Discussed with: CRNA  Anesthesia Plan Comments:         Anesthesia Quick Evaluation

## 2020-08-15 NOTE — Transfer of Care (Signed)
Immediate Anesthesia Transfer of Care Note  Patient: Shawn Webb  Procedure(s) Performed: LEFT-SIDED LUMBAR 5 - SACRUM 1 MICRODISECTOMY (Left )  Patient Location: PACU  Anesthesia Type:General  Level of Consciousness: awake, alert , oriented and patient cooperative  Airway & Oxygen Therapy: Patient Spontanous Breathing  Post-op Assessment: Report given to RN and Post -op Vital signs reviewed and stable  Post vital signs: Reviewed and stable  Last Vitals:  Vitals Value Taken Time  BP 131/84 08/15/20 1418  Temp 36.8 C 08/15/20 1418  Pulse 58 08/15/20 1424  Resp 14 08/15/20 1424  SpO2 95 % 08/15/20 1424  Vitals shown include unvalidated device data.  Last Pain:  Vitals:   08/15/20 1418  TempSrc:   PainSc: 0-No pain         Complications: No complications documented.

## 2020-08-15 NOTE — Anesthesia Procedure Notes (Signed)
Performed by: Tressia Miners, CRNA

## 2020-08-15 NOTE — H&P (Signed)
PREOPERATIVE H&P  Chief Complaint: Left leg pain  HPI: Shawn Webb is a 45 y.o. male who presents with ongoing pain in the left leg  MRI reveals a chronic left L5/S1 HNP  Patient has failed multiple forms of conservative care and continues to have pain (see office notes for additional details regarding the patient's full course of treatment)  Past Medical History:  Diagnosis Date  . Allergic rhinitis 11/21/2011  . Allergy   . Annual physical exam 11/21/2011  . Ascending aortic aneurysm (HCC) 11/29/2019  . Chest pain of uncertain etiology 08/17/2019  . Chicken pox   . Dyslipidemia 08/17/2019  . Essential hypertension 08/17/2019  . Hypertension   . PCP NOTES >>>> 03/05/2015   Past Surgical History:  Procedure Laterality Date  . NO PAST SURGERIES     Social History   Socioeconomic History  . Marital status: Married    Spouse name: Not on file  . Number of children: 2  . Years of education: Not on file  . Highest education level: Not on file  Occupational History  . Occupation: Runner, broadcasting/film/video, [employer], [employer]  Tobacco Use  . Smoking status: Never Smoker  . Smokeless tobacco: Never Used  Vaping Use  . Vaping Use: Never used  Substance and Sexual Activity  . Alcohol use: Not Currently    Comment: socially   . Drug use: Never  . Sexual activity: Not on file  Other Topics Concern  . Not on file  Social History Narrative   Children: 2011, 2016    Plays outdoor tennis, hiker.    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 Colon cancer Maternal Grandfather 71  . CAD Neg Hx   . Diabetes Neg Hx   . Prostate cancer Neg Hx    Allergies  Allergen Reactions  . Sulfa  Antibiotics Other (See Comments)    Unknown   Prior to Admission medications   Medication Sig Start Date End Date Taking? Authorizing Provider  amLODipine (NORVASC) 5 MG tablet Take 2 tablets (10 mg total) by mouth daily. 07/26/20  Yes Paz, Nolon Rod, MD  cetirizine (ZYRTEC) 5 MG tablet Take 5 mg by mouth daily.   Yes [provider]  escitalopram (LEXAPRO) 10 MG tablet Take 1 tablet (10 mg total) by mouth daily. 07/26/20  Yes Paz, Nolon Rod, MD  ibuprofen (ADVIL) 200 MG tablet Take 200 mg by mouth every 6 (six) hours as needed for moderate pain.   Yes [provider]  losartan (COZAAR) 50 MG tablet Take 1 tablet (50 mg total) by mouth daily. 06/19/20 09/17/20 Yes Georgeanna Lea, MD  pregabalin (LYRICA) 75 MG capsule Take 75 mg by mouth daily. 06/19/20  Yes [provider]     All other systems have been reviewed and were otherwise negative with the exception of those mentioned in the HPI and as above.  Physical Exam: There were no vitals filed for this visit.  Body mass index is 29.03 kg/m.  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

## 2020-08-15 NOTE — Op Note (Signed)
PATIENT NAME: Shawn Webb   MEDICAL RECORD NO.:   [id number]    DATE OF BIRTH: 12/29/1975   DATE OF PROCEDURE: 08/15/2020                               OPERATIVE REPORT     PREOPERATIVE DIAGNOSES: 1. Left-sided S1 radiculopathy. 2. 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   POSTOPERATIVE DIAGNOSES: 1. Left-sided S1 radiculopathy. 2. 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   PROCEDURES:  Left-sided L5-S1 laminotomy with partial facetectomy and removal of large herniated right-sided L5-S1 disk fragment.   SURGEON:  Estill Bamberg, MD.   ASSISTANTJason Coop, PA-C.   ANESTHESIA:  General endotracheal anesthesia.   COMPLICATIONS:  None.   DISPOSITION:  Stable.   ESTIMATED BLOOD LOSS:  Minimal.   INDICATIONS FOR SURGERY:  Briefly, Shawn Webb is a pleasant 45 year old patient, who did present to me with severe pain in the left leg.  The patient's MRI did reveal the findings outlined above, clearly notable for a prominent herniated disk fragment on the left at L5-S1.  The pain was rather severe, and had been ongoing for over 1 year.  We did discuss treatment options and we did ultimately elect to proceed with the procedure reflected above, after failure of conservative treatment measures including injections.  The patient was fully made aware of the risks of surgery, including the risk of recurrent herniation and the need for subsequent surgery, including the possibility of a subsequent diskectomy and/or fusion.   OPERATIVE DETAILS:  On 08/15/2020, the patient was brought to Surgery and general endotracheal anesthesia was administered.  The patient was placed prone on a well-padded flat Jackson bed with a spinal frame.  Antibiotics were given.  The back was prepped and draped and a time-out procedure was performed.  At this point, a midline incision was made  directly over the L5-S1 intervertebral space.  A curvilinear incision was made just to the left of the midline into the fascia.  A self-retaining McCulloch retractor was placed.  The lamina of L5 and S1 was identified and subperiosteally exposed.  I then performed a partial facetectomy on the left at L5-S1, and subsequently removed the lateral aspect of the L5-S1 ligamentum flavum.  Readily identified was the traversing left S1 nerve, which was noted to be under obvious tension, and was noted to be rather erythematous.  I was able to gently gain medial retraction of the nerve, and in doing so, a very large herniated disk fragment was readily noted.    An annulotomy was performed, and the herniation was removed in multiple fragments.  I did liberally irrigate through the annulotomy defect and use a nerve hook, and additional free fragments were identified and removed uneventfully.  The left S1 nerve was then evaluated and was noted to be entirely free and mobile. I was very pleased with the final decompression that I was able to accomplish.  At this point, the wound was copiously irrigated with normal saline.  All epidural bleeding was controlled using bipolar electrocautery in addition to Surgiflo. All bleeding was controlled at the termination of the procedure.  At this point, 30 mg of Depo-Medrol was introduced about the epidural space in the region of the right S1 nerve.  The wound was then closed  in layers using #1 Vicryl followed by 0 Vicryl, followed by 4-0 Monocryl. Benzoin and Steri-Strips were applied followed by a sterile dressing. All instrument counts were correct at the termination of the procedure.   Of note, Jason Coop was my assistant throughout surgery, and did aid in retraction, suctioning, and closure from start to finish.       Estill Bamberg, MD

## 2020-08-15 NOTE — Anesthesia Postprocedure Evaluation (Signed)
Anesthesia Post Note  Patient: Shawn Webb  Procedure(s) Performed: LEFT-SIDED LUMBAR 5 - SACRUM 1 MICRODISECTOMY (Left )     Patient location during evaluation: PACU Anesthesia Type: General Level of consciousness: awake and alert Pain management: pain level controlled Vital Signs Assessment: post-procedure vital signs reviewed and stable Respiratory status: spontaneous breathing, nonlabored ventilation, respiratory function stable and patient connected to nasal cannula oxygen Cardiovascular status: blood pressure returned to baseline and stable Postop Assessment: no apparent nausea or vomiting Anesthetic complications: no   No complications documented.  Last Vitals:  Vitals:   08/15/20 1448 08/15/20 1503  BP: 125/83 119/77  Pulse: (!) 53 (!) 50  Resp: 13 13  Temp:  36.8 C  SpO2: 95% 94%    Last Pain:  Vitals:   08/15/20 1503  TempSrc:   PainSc: 0-No pain                 Cecile Hearing

## 2020-08-15 NOTE — Anesthesia Procedure Notes (Signed)
Procedure Name: Intubation Date/Time: 08/15/2020 12:06 PM Performed by: Reece Agar, CRNA Pre-anesthesia Checklist: Patient identified, Emergency Drugs available, Suction available and Patient being monitored Patient Re-evaluated:Patient Re-evaluated prior to induction Oxygen Delivery Method: Circle System Utilized Preoxygenation: Pre-oxygenation with 100% oxygen Induction Type: IV induction Ventilation: Mask ventilation without difficulty Laryngoscope Size: Mac and 4 Tube type: Oral Tube size: 7.5 mm Number of attempts: 1 Airway Equipment and Method: Stylet Placement Confirmation: ETT inserted through vocal cords under direct vision,  positive ETCO2 and breath sounds checked- equal and bilateral Secured at: 24 cm Tube secured with: Tape Dental Injury: Teeth and Oropharynx as per pre-operative assessment

## 2020-08-16 ENCOUNTER — Encounter (HOSPITAL_COMMUNITY): Payer: Self-pay | Admitting: Orthopedic Surgery

## 2020-08-16 MED FILL — Thrombin (Recombinant) For Soln 20000 Unit: CUTANEOUS | Qty: 1 | Status: AC

## 2020-09-03 ENCOUNTER — Ambulatory Visit: Payer: BC Managed Care – PPO | Admitting: Internal Medicine

## 2020-09-07 ENCOUNTER — Other Ambulatory Visit: Payer: Self-pay | Admitting: Internal Medicine

## 2020-09-07 MED ORDER — AMLODIPINE BESYLATE 5 MG PO TABS: 10.0000 mg | ORAL_TABLET | Freq: Every day | ORAL | 1 refills | Status: DC

## 2020-09-25 ENCOUNTER — Other Ambulatory Visit: Payer: Self-pay

## 2020-09-25 ENCOUNTER — Ambulatory Visit: Payer: BC Managed Care – PPO | Admitting: Internal Medicine

## 2020-09-25 VITALS — BP 128/77 | HR 55 | Temp 97.5°F | Ht 73.0 in | Wt 218.8 lb

## 2020-09-25 DIAGNOSIS — F419 Anxiety disorder, unspecified: Secondary | ICD-10-CM | POA: Diagnosis not present

## 2020-09-25 DIAGNOSIS — I10 Essential (primary) hypertension: Secondary | ICD-10-CM | POA: Diagnosis not present

## 2020-09-25 LAB — BASIC METABOLIC PANEL
BUN: 11 mg/dL (ref 6–23)
CO2: 29 mEq/L (ref 19–32)
Calcium: 9.8 mg/dL (ref 8.4–10.5)
Chloride: 105 mEq/L (ref 96–112)
Creatinine, Ser: 0.99 mg/dL (ref 0.40–1.50)
GFR: 93.53 mL/min (ref >60.00)
Glucose, Bld: 84 mg/dL (ref 70–99)
Potassium: 4.3 mEq/L (ref 3.5–5.1)
Sodium: 141 mEq/L (ref 135–145)

## 2020-09-25 MED ORDER — ESCITALOPRAM OXALATE 5 MG PO TABS: 5.0000 mg | ORAL_TABLET | Freq: Every day | ORAL | 0 refills | Status: DC

## 2020-09-25 NOTE — Progress Notes (Signed)
Subjective:    Patient ID: Shawn Webb, male    DOB: 10/10/1975, 45 y.o.   MRN: [id number]  DOS:  09/25/2020 Type of visit - description: Routine follow-up  Today talked about anxiety, hypertension and his recent back surgery. He has back surgery went well. Also, he reports a new  family history of colon polyps    Review of Systems See above   Past Medical History:  Diagnosis Date  . Allergic rhinitis 11/21/2011  . Allergy   . Annual physical exam 11/21/2011  . Ascending aortic aneurysm (HCC) 11/29/2019  . Chest pain of uncertain etiology 08/17/2019  . Chicken pox   . Dyslipidemia 08/17/2019  . Essential hypertension 08/17/2019  . Hypertension   . PCP NOTES >>>> 03/05/2015    Past Surgical History:  Procedure Laterality Date  . LUMBAR LAMINECTOMY/DECOMPRESSION MICRODISCECTOMY Left 08/15/2020   Procedure: LEFT-SIDED LUMBAR 5 - SACRUM 1 MICRODISECTOMY;  Surgeon: Estill Bamberg, MD;  Location: [facility] OR;  Service: Orthopedics;  Laterality: Left;  . NO PAST SURGERIES      Allergies as of 09/25/2020      Reactions   Sulfa Antibiotics Other (See Comments)   Unknown      Medication List       Accurate as of Sep 25, 2020 10:27 AM. If you have any questions, ask your nurse or doctor.        amLODipine 5 MG tablet Commonly known as: NORVASC Take 2 tablets (10 mg total) by mouth daily.   cetirizine 5 MG tablet Commonly known as: ZYRTEC Take 5 mg by mouth daily.   escitalopram 10 MG tablet Commonly known as: LEXAPRO Take 1 tablet (10 mg total) by mouth daily.   HYDROcodone-acetaminophen 7.5-325 MG tablet Commonly known as: Norco Take 1 tablet by mouth every 6 (six) hours as needed for moderate pain.   losartan 50 MG tablet Commonly known as: COZAAR Take 1 tablet (50 mg total) by mouth daily.   methocarbamol 500 MG tablet Commonly known as: Robaxin Take 1 tablet (500 mg total) by mouth every 6 (six) hours as needed for muscle spasms.   pregabalin 75 MG  capsule Commonly known as: LYRICA Take 75 mg by mouth daily.          Objective:   Physical Exam BP 128/77 (BP Location: Right Arm, Patient Position: Sitting, Cuff Size: Large)   Pulse (!) 55   Temp (!) 97.5 F (36.4 C) (Temporal)   Ht 6\' 1"  (1.854 m)   Wt 218 lb 12.8 oz (99.2 kg)   SpO2 98%   BMI 28.87 kg/m  General:   Well developed, NAD, BMI noted. HEENT:  Normocephalic . Face symmetric, atraumatic Lungs:  CTA B Normal respiratory effort, no intercostal retractions, no accessory muscle use. Heart: RRR,  no murmur.  Lower extremities: no pretibial edema bilaterally  Skin: Not pale. Not jaundice Neurologic:  alert & oriented X3.  Speech normal, gait appropriate for age and unassisted Psych--  Cognition and judgment appear intact.  Cooperative with normal attention span and concentration.  Behavior appropriate. No anxious or depressed appearing.      Assessment    Asssessment HTN Blood donor (q 3 months) Mild thrombocytopenia Allergic rhinitis Rosacea: face Eczema (elbows) Ascending aortic dilatation, chest pain: (-) Stress test 10-2019.  Repeat CT chest 05/2020   PLAN: HTN: BP today is 128/77, at home is doing well 2.  On losartan, amlodipine 5 mg: 2 tablets daily.  Last BMP : K+ was elevated, recheck  a BMP.  Otherwise continue present care. Anxiety: See last visit, taking Lexapro with excellent control of his symptoms.  On looking back he thinks he was very nervous because he was about to have surgery. We agreed to decrease Lexapro to 5 mg daily for 3 weeks then stop.  Call if symptoms resurface. + FH colon polyps: Mother, diagnosed at age 73.  Patient has no symptoms, I recommend to proceed with a colonoscopy at age 77. Back pain: Had a L5-S1 microdiscectomy, doing great. RTC CPX 6 months  This visit occurred during the SARS-CoV-2 public health emergency.  Safety protocols were in place, including screening questions prior to the visit, additional usage of  staff PPE, and extensive cleaning of exam room while observing appropriate contact time as indicated for disinfecting solutions.

## 2020-09-25 NOTE — Assessment & Plan Note (Signed)
HTN: BP today is 128/77, at home is doing well 2.  On losartan, amlodipine 5 mg: 2 tablets daily.  Last BMP : K+ was elevated, recheck a BMP.  Otherwise continue present care. Anxiety: See last visit, taking Lexapro with excellent control of his symptoms.  On looking back he thinks he was very nervous because he was about to have surgery. We agreed to decrease Lexapro to 5 mg daily for 3 weeks then stop.  Call if symptoms resurface. + FH colon polyps: Mother, diagnosed at age 31.  Patient has no symptoms, I recommend to proceed with a colonoscopy at age 68. Back pain: Had a L5-S1 microdiscectomy, doing great. RTC CPX 6 months

## 2020-09-25 NOTE — Patient Instructions (Signed)
Check the  blood pressure   BP GOAL is between 110/65 and  135/85. If it is consistently higher or lower, let me know  Decrease Lexapro to 5 mg a day, after 3 weeks you can stop it.  If anxiety resurface let us know.  GO TO THE LAB : Get the blood work     GO TO THE FRONT DESK, PLEASE SCHEDULE YOUR APPOINTMENTS Come back for a physical exam in 6 months

## 2020-10-05 ENCOUNTER — Other Ambulatory Visit: Payer: Self-pay

## 2020-10-09 MED ORDER — LOSARTAN POTASSIUM 50 MG PO TABS: 50.0000 mg | ORAL_TABLET | Freq: Every day | ORAL | 3 refills | Status: DC

## 2020-10-09 NOTE — Telephone Encounter (Signed)
Refill sent to pharmacy.   

## 2020-10-15 ENCOUNTER — Other Ambulatory Visit: Payer: Self-pay | Admitting: Cardiology

## 2020-10-15 NOTE — Telephone Encounter (Signed)
Refill sent to pharmacy.   

## 2020-10-21 ENCOUNTER — Other Ambulatory Visit: Payer: Self-pay | Admitting: Internal Medicine

## 2020-12-11 ENCOUNTER — Encounter: Payer: Self-pay | Admitting: Internal Medicine

## 2020-12-11 ENCOUNTER — Telehealth (INDEPENDENT_AMBULATORY_CARE_PROVIDER_SITE_OTHER): Payer: 59 | Admitting: Internal Medicine

## 2020-12-11 ENCOUNTER — Other Ambulatory Visit: Payer: Self-pay

## 2020-12-11 VITALS — Ht 73.0 in | Wt 220.0 lb

## 2020-12-11 DIAGNOSIS — F419 Anxiety disorder, unspecified: Secondary | ICD-10-CM

## 2020-12-11 DIAGNOSIS — R42 Dizziness and giddiness: Secondary | ICD-10-CM

## 2020-12-11 DIAGNOSIS — I10 Essential (primary) hypertension: Secondary | ICD-10-CM

## 2020-12-11 MED ORDER — AMLODIPINE BESYLATE 5 MG PO TABS: 10.0000 mg | ORAL_TABLET | Freq: Every day | ORAL | Status: DC

## 2020-12-11 MED ORDER — ESCITALOPRAM OXALATE 10 MG PO TABS: 10.0000 mg | ORAL_TABLET | Freq: Every day | ORAL | 3 refills | Status: DC

## 2020-12-11 NOTE — Progress Notes (Signed)
Subjective:    Patient ID: Shawn Webb, male    DOB: Aug 20, 1975, 45 y.o.   MRN: [id number]  DOS:  12/11/2020 Type of visit - description: Virtual Visit via Video Note  I connected with the above patient  by a video enabled telemedicine application and verified that I am speaking with the correct person using two identifiers.   THIS ENCOUNTER IS A VIRTUAL VISIT DUE TO COVID-19 - PATIENT WAS NOT SEEN IN THE OFFICE. PATIENT HAS CONSENTED TO VIRTUAL VISIT / TELEMEDICINE VISIT   Location of patient: home  Location of provider: office  Persons participating in the virtual visit: patient, provider   I discussed the limitations of evaluation and management by telemedicine and the availability of in person appointments. The patient expressed understanding and agreed to proceed.  Acute He like to discuss several issues:  C/o  lightheadedness, this is going on for 4 to 5 weeks, is not dizziness or spinning.  Is a very subtle lightheaded feeling. Typically happen during the daytime, between 10 AM and 4 PM. Not worse by standing up, no worse by turning in bed. He denies headache, chest pain or difficulty breathing. Denies any strokelike symptoms such as double vision, slurred speech or facial numbness.   Anxiety: Since the last visit, Lexapro was weaned off, since then he feels  anxious again. Things at home at work are okay (although he has a new job), he is just concerned about having young daughters and "how things are going in the world".  Blood pressure is not well controlled at home, he reports that BP sometimes is 180, 190. Good compliance with medication, has some ankle edema, it was hard for him to quantify the swelling.   Review of Systems See above   Past Medical History:  Diagnosis Date   Allergic rhinitis 11/21/2011   Allergy    Annual physical exam 11/21/2011   Ascending aortic aneurysm (HCC) 11/29/2019   Chest pain of uncertain etiology 08/17/2019   Chicken pox     Dyslipidemia 08/17/2019   Essential hypertension 08/17/2019   Hypertension    PCP NOTES >>>> 03/05/2015    Past Surgical History:  Procedure Laterality Date   LUMBAR LAMINECTOMY/DECOMPRESSION MICRODISCECTOMY Left 08/15/2020   Procedure: LEFT-SIDED LUMBAR 5 - SACRUM 1 MICRODISECTOMY;  Surgeon: Estill Bamberg, MD;  Location: [facility] OR;  Service: Orthopedics;  Laterality: Left;   NO PAST SURGERIES      Allergies as of 12/11/2020       Reactions   Sulfa Antibiotics Other (See Comments)   Unknown        Medication List        Accurate as of December 11, 2020  4:50 PM. If you have any questions, ask your nurse or doctor.          amLODipine 5 MG tablet Commonly known as: NORVASC TAKE 1 TABLET (5 MG TOTAL) BY MOUTH DAILY.   cetirizine 5 MG tablet Commonly known as: ZYRTEC Take 5 mg by mouth daily.   escitalopram 5 MG tablet Commonly known as: LEXAPRO Take 1 tablet (5 mg total) by mouth daily.   losartan 50 MG tablet Commonly known as: COZAAR Take 1 tablet (50 mg total) by mouth daily.           Objective:   Physical Exam Ht 6\' 1"  (1.854 m)   Wt 220 lb (99.8 kg)   BMI 29.03 kg/m  This is a virtual video visit, alert oriented x3, in no apparent distress.  Assessment     Asssessment HTN Blood donor (q 3 months) Mild thrombocytopenia Allergic rhinitis Rosacea: face Eczema (elbows) Ascending aortic dilatation, chest pain: (-) Stress test 10-2019.  Repeat CT chest 05/2020   PLAN: Lightheadedness:  Ill-defined lightheaded w/o red flag symptoms such as headache, slurred speech.  I asked him to come next week, we will reassess  and do a physical exam. Anxiety: See last visit, patient weaned off Lexapro, anxiety has resurfaced, possibly multifactorial (new job, worries about the future , etc) Plan: We agreed to restart Lexapro 10 mg daily, he is strongly considering psychotherapy, praised. HTN: Not well controlled at home, BP sometimes spikes to 180, 190.  I  reviewed the readings from the computer and they are basically okay except for a couple of occasions.  He also have some peri-ankle edema, hard for him to quantify it. Plan: Continue losartan, amlodipine, monitor BPs at home, office visit in person next week, bring his BP cuff so we can compare with ours. Consider adjust medicines. He verbalized understand   I discussed the assessment and treatment plan with the patient. The patient was provided an opportunity to ask questions and all were answered. The patient agreed with the plan and demonstrated an understanding of the instructions.   The patient was advised to call back or seek an in-person evaluation if the symptoms worsen or if the condition fails to improve as anticipated.

## 2020-12-12 NOTE — Assessment & Plan Note (Signed)
Lightheadedness:  Ill-defined lightheaded w/o red flag symptoms such as headache, slurred speech.  I asked him to come next week, we will reassess  and do a physical exam. Anxiety: See last visit, patient weaned off Lexapro, anxiety has resurfaced, possibly multifactorial (new job, worries about the future , etc) Plan: We agreed to restart Lexapro 10 mg daily, he is strongly considering psychotherapy, praised. HTN: Not well controlled at home, BP sometimes spikes to 180, 190.  I reviewed the readings from the computer and they are basically okay except for a couple of occasions.  He also have some peri-ankle edema, hard for him to quantify it. Plan: Continue losartan, amlodipine, monitor BPs at home, office visit in person next week, bring his BP cuff so we can compare with ours. Consider adjust medicines. He verbalized understand

## 2020-12-19 ENCOUNTER — Encounter: Payer: Self-pay | Admitting: Internal Medicine

## 2020-12-19 ENCOUNTER — Other Ambulatory Visit: Payer: Self-pay

## 2020-12-19 ENCOUNTER — Ambulatory Visit: Payer: 59 | Admitting: Internal Medicine

## 2020-12-19 VITALS — BP 126/82 | HR 57 | Temp 98.1°F | Resp 16 | Ht 73.0 in | Wt 221.5 lb

## 2020-12-19 DIAGNOSIS — F419 Anxiety disorder, unspecified: Secondary | ICD-10-CM

## 2020-12-19 DIAGNOSIS — I10 Essential (primary) hypertension: Secondary | ICD-10-CM

## 2020-12-19 DIAGNOSIS — R42 Dizziness and giddiness: Secondary | ICD-10-CM | POA: Diagnosis not present

## 2020-12-19 NOTE — Progress Notes (Signed)
Subjective:    Patient ID: Shawn Webb, male    DOB: February 24, 1976, 45 y.o.   MRN: [id number]  DOS:  12/19/2020 Type of visit - description: f/u  See last visit. He was feeling quite anxious, restart Lexapro, feels better.  BP was elevated, now it has improved without change in his medications.  He also complained of lightheadedness, a very subtle feeling, typically started after he takes his BP meds in the morning stops by 1:30 PM on the clock. No associated chest pain, difficulty breathing, palpitations. No diplopia, slurred speech or motor deficits.  Also reported edema at the lower extremities, it is mild.  Review of Systems See above   Past Medical History:  Diagnosis Date   Allergic rhinitis 11/21/2011   Allergy    Annual physical exam 11/21/2011   Ascending aortic aneurysm (HCC) 11/29/2019   Chest pain of uncertain etiology 08/17/2019   Chicken pox    Dyslipidemia 08/17/2019   Essential hypertension 08/17/2019   Hypertension    PCP NOTES >>>> 03/05/2015    Past Surgical History:  Procedure Laterality Date   LUMBAR LAMINECTOMY/DECOMPRESSION MICRODISCECTOMY Left 08/15/2020   Procedure: LEFT-SIDED LUMBAR 5 - SACRUM 1 MICRODISECTOMY;  Surgeon: Estill Bamberg, MD;  Location: [facility] OR;  Service: Orthopedics;  Laterality: Left;   NO PAST SURGERIES      Allergies as of 12/19/2020       Reactions   Sulfa Antibiotics Other (See Comments)   Unknown        Medication List        Accurate as of December 19, 2020  4:12 PM. If you have any questions, ask your nurse or doctor.          amLODipine 5 MG tablet Commonly known as: NORVASC Take 2 tablets (10 mg total) by mouth daily.   cetirizine 5 MG tablet Commonly known as: ZYRTEC Take 5 mg by mouth daily.   escitalopram 10 MG tablet Commonly known as: LEXAPRO Take 1 tablet (10 mg total) by mouth daily.   losartan 50 MG tablet Commonly known as: COZAAR Take 1 tablet (50 mg total) by mouth daily.            Objective:   Physical Exam BP 126/82 (BP Location: Left Arm, Patient Position: Sitting, Cuff Size: Normal)   Pulse (!) 57   Temp 98.1 F (36.7 C) (Oral)   Resp 16   Ht 6\' 1"  (1.854 m)   Wt 221 lb 8 oz (100.5 kg)   SpO2 97%   BMI 29.22 kg/m  General:   Well developed, NAD, BMI noted. HEENT:  Normocephalic . Face symmetric, atraumatic Neck, normal carotid pulses. Lungs:  CTA B Normal respiratory effort, no intercostal retractions, no accessory muscle use. Heart: RRR,  no murmur.  Lower extremities: no pretibial edema bilaterally , + mild sock marks  Skin: Not pale. Not jaundice Neurologic:  alert & oriented X3.  Speech normal, gait appropriate for age and unassisted.  Motor and DTR symmetric.  EOMI. Psych--  Cognition and judgment appear intact.  Cooperative with normal attention span and concentration.  Behavior appropriate. No anxious or depressed appearing.      Assessment      Asssessment HTN Blood donor (q 3 months) Mild thrombocytopenia Allergic rhinitis Rosacea: face Eczema (elbows) Ascending aortic dilatation, chest pain: (-) Stress test 10-2019.  Repeat CT chest 05/2020   PLAN: HTN: See last visit, BP was elevated, it is now better controlled at home: 140/72, 132/79, 125/89, 113/90.  He brought his BP cuff and it seems to be accurate. He started losartan in April 2021 and amlodipine January 2022.  He experiences lightheadedness after BP meds, feeling last few hours.  Sxs could be related to the medicines.  He also has a mild lower extremity edema. Plan:  Change timing of BP meds to nighttime, if lightheadedness continue or edema is worse let me know, consider DC amlodipine and increase losartan.  Continue monitoring BPs Anxiety: See last visit, restarted Lexapro, feeling much better. RTC: Already scheduled for November   This visit occurred during the SARS-CoV-2 public health emergency.  Safety protocols were in place, including screening questions prior to  the visit, additional usage of staff PPE, and extensive cleaning of exam room while observing appropriate contact time as indicated for disinfecting solutions.

## 2020-12-19 NOTE — Patient Instructions (Addendum)
Take the blood pressure medicines  at night time   You can take  Lexapro in the morning if you like to  Hopefully that will take care of the lightheadedness  If the swelling gets worse let me know  Check the  blood pressure at different time of the day after resting x 10 minutes BP GOAL is between 110/65 and  135/85. If it is consistently higher or lower, let me know   See you in November

## 2020-12-20 NOTE — Assessment & Plan Note (Signed)
HTN: See last visit, BP was elevated, it is now better controlled at home: 140/72, 132/79, 125/89, 113/90.  He brought his BP cuff and it seems to be accurate. He started losartan in April 2021 and amlodipine January 2022.  He experiences lightheadedness after BP meds, feeling last few hours.  Sxs could be related to the medicines.  He also has a mild lower extremity edema. Plan:  Change timing of BP meds to nighttime, if lightheadedness continue or edema is worse let me know, consider DC amlodipine and increase losartan.  Continue monitoring BPs Anxiety: See last visit, restarted Lexapro, feeling much better. RTC: Already scheduled for November

## 2021-01-10 ENCOUNTER — Telehealth (INDEPENDENT_AMBULATORY_CARE_PROVIDER_SITE_OTHER): Payer: 59 | Admitting: Family Medicine

## 2021-01-10 ENCOUNTER — Encounter: Payer: Self-pay | Admitting: Family Medicine

## 2021-01-10 DIAGNOSIS — U071 COVID-19: Secondary | ICD-10-CM

## 2021-01-10 MED ORDER — MOLNUPIRAVIR EUA 200MG CAPSULE: 4.0000 | ORAL_CAPSULE | Freq: Two times a day (BID) | ORAL | 0 refills | Status: AC

## 2021-01-10 NOTE — Patient Instructions (Addendum)
---------------------------------------------------------------------------------------------------------------------------    WORK SLIP:  Patient Shawn Webb,  1975-07-28, was seen for a medical visit today, 01/10/21 . Please excuse from work for a COVID like illness. We advise 10 days minimum from the onset of symptoms (01/07/21) PLUS 1 day of no fever and improved symptoms. Will defer to employer for a sooner return to work if symptoms have resolved, it is greater than 5 days since the positive test and the patient can wear a high-quality, tight fitting mask such as N95 or KN95 at all times for an additional 5 days. Would also suggest COVID19 antigen testing is negative prior to return.  Sincerely: E-signature: Dr. Colin Benton, DO [facility] Ph: [phone]   ------------------------------------------------------------------------------------------------------------------------------    HOME CARE TIPS:  -I sent the medication(s) we discussed to your pharmacy: Meds ordered this encounter  Medications   molnupiravir EUA (LAGEVRIO) 200 mg CAPS capsule    Sig: Take 4 capsules (800 mg total) by mouth 2 (two) times daily for 5 days.    Dispense:  40 capsule    Refill:  0     -I sent in the [facility] treatment or referral you requested per our discussion. Please see the information provided below and discuss further with the pharmacist/treatment team.   -there is a chance of rebound illness after finishing your treatment. If you become sick again please isolate for an additional 5 days.   For individuals who are sexually active with partners who are able to become pregnant: ? It is not known if LAGEVRIO can affect sperm. While the risk is regarded as low, animal studies to fully assess the potential for LAGEVRIO to affect the babies of males treated with LAGEVRIO have not been completed. A reliable method of birth control (contraception) should be used  consistently and correctly during treatment with LAGEVRIO and for at least 3 months after the last dose. The risk to sperm beyond 3 months is not known. Studies to understand the risk to sperm beyond 3 months are ongoing. Talk to your healthcare provider about reliable birth control methods. Talk to your healthcare provider if you have questions or concerns about how LAGEVRIO may affect sperm.   -can use tylenol or aleve if needed for fevers, aches and pains per instructions  -can use nasal saline a few times per day if you have nasal congestion; sometimes  a short course of Afrin nasal spray for 3 days can help with symptoms as well  -stay hydrated, drink plenty of fluids and eat small healthy meals - avoid dairy  -can take 1000 IU (15mg) Vit D3 and 100-500 mg of Vit C daily per instructions   check out the CDC website for more information on home care, transmission and treatment for COVID19  -follow up with your doctor in 2-3 days unless improving and feeling better  -stay home while sick, except to seek medical care. If you have COVID19, ideally it would be best to stay home for a full 10 days since the onset of symptoms PLUS one day of no fever and feeling better. Wear a good mask that fits snugly (such as N95 or KN95) if around others to reduce the risk of transmission.  It was nice to meet you today, and I really hope you are feeling better soon. I help [facility] out with telemedicine visits on Tuesdays and Thursdays and am available for visits on those days. If you have any concerns or questions following this visit  please schedule a follow up visit with your Primary Care doctor or seek care at a local urgent care clinic to avoid delays in care.    Seek in person care or schedule a follow up video visit promptly if your symptoms worsen, new concerns arise or you are not improving with treatment. Call 911 and/or seek emergency care if your symptoms are severe or life threatening.     Fact Sheet for Patients And Caregivers Emergency Use Authorization (EUA) Of LAGEVRIOT (molnupiravir) capsules For Coronavirus Disease 2019 (COVID-19)  What is the most important information I should know about LAGEVRIO? LAGEVRIO may cause serious side effects, including: ? LAGEVRIO may cause harm to your unborn baby. It is not known if LAGEVRIO will harm your baby if you take LAGEVRIO during pregnancy. o LAGEVRIO is not recommended for use in pregnancy. o LAGEVRIO has not been studied in pregnancy. LAGEVRIO was studied in pregnant animals only. When LAGEVRIO was given to pregnant animals, LAGEVRIO caused harm to their unborn babies. o You and your healthcare provider may decide that you should take LAGEVRIO during pregnancy if there are no other COVID-19 treatment options approved or authorized by the FDA that are accessible or clinically appropriate for you. o If you and your healthcare provider decide that you should take LAGEVRIO during pregnancy, you and your healthcare provider should discuss the known and potential benefits and the potential risks of taking LAGEVRIO during pregnancy. For individuals who are able to become pregnant: ? You should use a reliable method of birth control (contraception) consistently and correctly during treatment with LAGEVRIO and for 4 days after the last dose of LAGEVRIO. Talk to your healthcare provider about reliable birth control methods. ? Before starting treatment with [facility] your healthcare provider may do a pregnancy test to see if you are pregnant before starting treatment with LAGEVRIO. ? Tell your healthcare provider right away if you become pregnant or think you may be pregnant during treatment with LAGEVRIO. Pregnancy Surveillance Program: ? There is a pregnancy surveillance program for individuals who take LAGEVRIO during pregnancy. The purpose of this program is to collect information about the health of you and your baby. Talk to  your healthcare provider about how to take part in this program. ? If you take LAGEVRIO during pregnancy and you agree to participate in the pregnancy surveillance program and allow your healthcare provider to share your information with [place], then your healthcare provider will report your use of [place] during pregnancy to [email or web address]. by calling [phone] or [email or web address]. For individuals who are sexually active with partners who are able to become pregnant: ? It is not known if LAGEVRIO can affect sperm. While the risk is regarded as low, animal studies to fully assess the potential for LAGEVRIO to affect the babies of males treated with LAGEVRIO have not been completed. A reliable method of birth control (contraception) should be used consistently and correctly during treatment with LAGEVRIO and for at least 3 months after the last dose. The risk to sperm beyond 3 months is not known. Studies to understand the risk to sperm beyond 3 months are ongoing. Talk to your healthcare provider about reliable birth control methods. Talk to your healthcare provider if you have questions or concerns about how LAGEVRIO may affect sperm. You are being given this fact sheet because your healthcare provider believes it is necessary to provide you with LAGEVRIO for the treatment of adults with mild-to-moderate coronavirus disease 2019 (COVID-19)  with positive results of direct SARS-CoV-2 viral testing, and who are at high risk for progression to severe COVID-19 including hospitalization or death, and for whom other COVID-19 treatment options approved or authorized by the FDA are not accessible or clinically appropriate. The U.S. Food and Drug Administration (FDA) has issued an Emergency Use Authorization (EUA) to make LAGEVRIO available during the COVID-19 pandemic (for more details about an EUA please see "What is an Emergency Use Authorization?" at the end  of this document). LAGEVRIO is not an FDA-approved medicine in the [place]. Read this Fact Sheet for information about LAGEVRIO. Talk to your healthcare provider about your options if you have any questions. It is your choice to take LAGEVRIO.  What is COVID-19? COVID-19 is caused by a virus called a coronavirus. You can get COVID-19 through close contact with another person who has the virus. COVID-19 illnesses have ranged from very mild-to-severe, including illness resulting in death. While information so far suggests that most COVID-19 illness is mild, serious illness can happen and may cause some of your other medical conditions to become worse. Older people and people of all ages with severe, long lasting (chronic) medical conditions like heart disease, lung disease and diabetes, for example seem to be at higher risk of being hospitalized for COVID-19.  What is LAGEVRIO? LAGEVRIO is an investigational medicine used to treat mild-to-moderate COVID-19 in adults: ? with positive results of direct SARS-CoV-2 viral testing, and ? who are at high risk for progression to severe COVID-19 including hospitalization or death, and for whom other COVID-19 treatment options approved or authorized by the FDA are not accessible or clinically appropriate. The FDA has authorized the emergency use of LAGEVRIO for the treatment of mild-tomoderate COVID-19 in adults under an EUA. For more information on EUA, see the "What is an Emergency Use Authorization (EUA)?" section at the end of this Fact Sheet. LAGEVRIO is not authorized: ? for use in people less than 45 years of age. ? for prevention of COVID-19. ? for people needing hospitalization for COVID-19. ? for use for longer than 5 consecutive days.  What should I tell my healthcare provider before I take LAGEVRIO? Tell your healthcare provider if you: ? Have any allergies ? Are breastfeeding or plan to breastfeed ? Have any serious  illnesses ? Are taking any medicines (prescription, over-the-counter, vitamins, or herbal products).  How do I take LAGEVRIO? ? Take LAGEVRIO exactly as your healthcare provider tells you to take it. ? Take 4 capsules of LAGEVRIO every 12 hours (for example, at 8 am and at 8 pm) ? Take LAGEVRIO for 5 days. It is important that you complete the full 5 days of treatment with LAGEVRIO. Do not stop taking LAGEVRIO before you complete the full 5 days of treatment, even if you feel better. ? Take LAGEVRIO with or without food. ? You should stay in isolation for as long as your healthcare provider tells you to. Talk to your healthcare provider if you are not sure about how to properly isolate while you have COVID-19. ? Swallow LAGEVRIO capsules whole. Do not open, break, or crush the capsules. If you cannot swallow capsules whole, tell your healthcare provider. ? What to do if you miss a dose: o If it has been less than 10 hours since the missed dose, take it as soon as you remember o If it has been more than 10 hours since the missed dose, skip the missed dose and take your dose at the  next scheduled time. ? Do not double the dose of LAGEVRIO to make up for a missed dose.  What are the important possible side effects of LAGEVRIO? ? See, "What is the most important information I should know about LAGEVRIO?" ? Allergic Reactions. Allergic reactions can happen in people taking LAGEVRIO, even after only 1 dose. Stop taking LAGEVRIO and call your healthcare provider right away if you get any of the following symptoms of an allergic reaction: o hives o rapid heartbeat o trouble swallowing or breathing o swelling of the mouth, lips, or face o throat tightness o hoarseness o skin rash The most common side effects of LAGEVRIO are: ? diarrhea ? nausea ? dizziness These are not all the possible side effects of LAGEVRIO. Not many people have taken LAGEVRIO. Serious and unexpected side effects  may happen. This medicine is still being studied, so it is possible that all of the risks are not known at this time.  What other treatment choices are there?  Veklury (remdesivir) is FDA-approved as an intravenous (IV) infusion for the treatment of mildto-moderate FYBOF-75 in certain adults and children. Talk with your doctor to see if Marijean Heath is appropriate for you. Like LAGEVRIO, FDA may also allow for the emergency use of other medicines to treat people with COVID-19. Go to LacrosseProperties.si for more information. It is your choice to be treated or not to be treated with LAGEVRIO. Should you decide not to take it, it will not change your standard medical care.  What if I am breastfeeding? Breastfeeding is not recommended during treatment with LAGEVRIO and for 4 days after the last dose of LAGEVRIO. If you are breastfeeding or plan to breastfeed, talk to your healthcare provider about your options and specific situation before taking LAGEVRIO.  How do I report side effects with LAGEVRIO? Contact your healthcare provider if you have any side effects that bother you or do not go away. Report side effects to FDA MedWatch at SmoothHits.hu or call 1-800-FDA-1088 (1- 936-066-6752).  How should I store [place]? ? Store LAGEVRIO capsules at room temperature between 48F to 71F (20C to 25C). ? Keep LAGEVRIO and all medicines out of the reach of children and pets. How can I learn more about COVID-19? ? Ask your healthcare provider. ? Visit SeekRooms.co.uk ? Contact your local or state public health department. ? Call [place] at [phone] (toll free in the [place] ? Visit [email or web address].com  What Is an Emergency Use Authorization (EUA)? The [place] FDA has made [place] available under an emergency access mechanism called an Emergency Use  Authorization (EUA) The EUA is supported by a Presenter, broadcasting Health and Human Service (HHS) declaration that circumstances exist to justify emergency use of drugs and biological products during the COVID-19 pandemic. LAGEVRIO for the treatment of mild-to-moderate COVID-19 in adults with positive results of direct SARS-CoV-2 viral testing, who are at high risk for progression to severe COVID-19, including hospitalization or death, and for whom alternative COVID-19 treatment options approved or authorized by FDA are not accessible or clinically appropriate, has not undergone the same type of review as an FDA-approved product. In issuing an EUA under the UMPNT-61 public health emergency, the FDA has determined, among other things, that based on the total amount of scientific evidence available including data from adequate and well-controlled clinical trials, if available, it is reasonable to believe that the product may be effective for diagnosing, treating, or preventing COVID-19, or a serious or life-threatening disease or condition caused by  COVID-19; that the known and potential benefits of the product, when used to diagnose, treat, or prevent such disease or condition, outweigh the known and potential risks of such product; and that there are no adequate, approved, and available alternatives.  All of these criteria must be met to allow for the product to be used in the treatment of patients during the COVID-19 pandemic. The EUA for LAGEVRIO is in effect for the duration of the COVID-19 declaration justifying emergency use of LAGEVRIO, unless terminated or revoked (after which LAGEVRIO may no longer be used under the EUA). For patent information: http://rogers.info/ Copyright  2021-2022 [place] [place], [place] and its affiliates. All rights reserved. [id number] Revised: March 2022

## 2021-01-10 NOTE — Progress Notes (Signed)
Virtual Visit via Video Note  I connected with Shawn Webb  on 01/10/21 at  5:40 PM EDT by a video enabled telemedicine application and verified that I am speaking with the correct person using two identifiers.  Location patient: home, [place] Location provider:work or home office Persons participating in the virtual visit: patient, provider  I discussed the limitations of evaluation and management by telemedicine and the availability of in person appointments. The patient expressed understanding and agreed to proceed.   HPI:  Acute telemedicine visit for Covid19: -Onset: 3 days ago; positive home covid test 2 days ago -Symptoms include: nasal congestion, cough mild, headache, low grade fever, headache, chills, tired -Denies:CP, SOB, NVD, inability to eat/drink/get out of bed -over the counter cold and flu medicine -Pertinent past medical history:see below -Pertinent medication allergies:  Allergies  Allergen Reactions   Sulfa Antibiotics Other (See Comments)    Unknown  -COVID-19 vaccine status: vaccinated x2 and did have a booster as well -GFR was 93 in May  ROS: See pertinent positives and negatives per HPI.  Past Medical History:  Diagnosis Date   Allergic rhinitis 11/21/2011   Allergy    Annual physical exam 11/21/2011   Ascending aortic aneurysm (HCC) 11/29/2019   Chest pain of uncertain etiology 08/17/2019   Chicken pox    Dyslipidemia 08/17/2019   Essential hypertension 08/17/2019   Hypertension    PCP NOTES >>>> 03/05/2015    Past Surgical History:  Procedure Laterality Date   LUMBAR LAMINECTOMY/DECOMPRESSION MICRODISCECTOMY Left 08/15/2020   Procedure: LEFT-SIDED LUMBAR 5 - SACRUM 1 MICRODISECTOMY;  Surgeon: Estill Bamberg, MD;  Location: [facility] OR;  Service: Orthopedics;  Laterality: Left;   NO PAST SURGERIES       Current Outpatient Medications:    amLODipine (NORVASC) 5 MG tablet, Take 2 tablets (10 mg total) by mouth daily., Disp: , Rfl:    cetirizine (ZYRTEC) 5 MG  tablet, Take 5 mg by mouth daily., Disp: , Rfl:    escitalopram (LEXAPRO) 10 MG tablet, Take 1 tablet (10 mg total) by mouth daily., Disp: 30 tablet, Rfl: 3   molnupiravir EUA (LAGEVRIO) 200 mg CAPS capsule, Take 4 capsules (800 mg total) by mouth 2 (two) times daily for 5 days., Disp: 40 capsule, Rfl: 0   losartan (COZAAR) 50 MG tablet, Take 1 tablet (50 mg total) by mouth daily., Disp: 90 tablet, Rfl: 3  EXAM:  VITALS per patient if applicable:  GENERAL: alert, oriented, appears well and in no acute distress  HEENT: atraumatic, conjunttiva clear, no obvious abnormalities on inspection of external nose and ears  NECK: normal movements of the head and neck  LUNGS: on inspection no signs of respiratory distress, breathing rate appears normal, no obvious gross SOB, gasping or wheezing  CV: no obvious cyanosis  MS: moves all visible extremities without noticeable abnormality  PSYCH/NEURO: pleasant and cooperative, no obvious depression or anxiety, speech and thought processing grossly intact  ASSESSMENT AND PLAN:  Discussed the following assessment and plan:  COVID-19   Discussed treatment options (infusions and oral options and risk of drug interactions), ideal treatment window, potential complications, isolation and precautions for COVID-19.  Discussed possibility of rebound with antivirals and the need to reisolate if it should occur for 5 days. Checked for/reviewed any labs done in the last 90 days with GFR listed in HPI if available. After lengthy discussion, the patient opted for treatment with molnupiravir due to being higher risk for complications of covid or severe disease and other factors. Discussed  EUA status of this drug and the fact that there is preliminary limited knowledge of risks/interactions/side effects per EUA document vs possible benefits and precautions. This information was shared with patient during the visit and also was provided in patient instructions. Also,  advised that patient discuss risks/interactions and use with pharmacist/treatment team as well.  Other symptomatic care measures summarized in patient instructions. Work/School slipped offered: provided in patient instructions   Advised to seek prompt in person care if worsening, new symptoms arise, or if is not improving with treatment. Discussed options for inperson care if PCP office not available. Did let this patient know that I only do telemedicine on Tuesdays and Thursdays for [place]. Advised to schedule follow up visit with PCP or [facility] if any further questions or concerns to avoid delays in care.   I discussed the assessment and treatment plan with the patient. The patient was provided an opportunity to ask questions and all were answered. The patient agreed with the plan and demonstrated an understanding of the instructions.     Terressa Koyanagi, DO

## 2021-01-17 ENCOUNTER — Ambulatory Visit: Payer: BC Managed Care – PPO | Admitting: Cardiology

## 2021-02-10 ENCOUNTER — Other Ambulatory Visit: Payer: Self-pay | Admitting: Internal Medicine

## 2021-03-19 ENCOUNTER — Telehealth: Payer: Self-pay

## 2021-03-19 NOTE — Telephone Encounter (Signed)
Letter has been sent to patient informing them that their sleep study has expired. Patient will need to call and schedule an office visit to re-evaluate the need for a sleep study.    

## 2021-03-25 ENCOUNTER — Other Ambulatory Visit: Payer: Self-pay

## 2021-03-25 MED ORDER — AMLODIPINE BESYLATE 5 MG PO TABS: 10.0000 mg | ORAL_TABLET | Freq: Every day | ORAL | 1 refills | Status: DC

## 2021-03-25 MED ORDER — ESCITALOPRAM OXALATE 10 MG PO TABS: 10.0000 mg | ORAL_TABLET | Freq: Every day | ORAL | 1 refills | Status: DC

## 2021-03-26 ENCOUNTER — Encounter: Payer: BC Managed Care – PPO | Admitting: Internal Medicine

## 2021-03-26 ENCOUNTER — Other Ambulatory Visit: Payer: Self-pay

## 2021-03-26 MED ORDER — LOSARTAN POTASSIUM 50 MG PO TABS: 50.0000 mg | ORAL_TABLET | Freq: Every day | ORAL | 0 refills | Status: DC

## 2021-03-26 NOTE — Telephone Encounter (Signed)
Losartan 50 mg # 90 only , patient needs appointment for future refills. Message sent to patient and pharmacy 1st attempt

## 2021-04-01 ENCOUNTER — Other Ambulatory Visit: Payer: Self-pay | Admitting: *Deleted

## 2021-04-01 MED ORDER — LOSARTAN POTASSIUM 50 MG PO TABS: 50.0000 mg | ORAL_TABLET | Freq: Every day | ORAL | 0 refills | Status: DC

## 2021-04-01 NOTE — Telephone Encounter (Signed)
Rx refill sent to pharmacy. 

## 2021-06-28 ENCOUNTER — Encounter: Payer: Self-pay | Admitting: Internal Medicine

## 2021-07-08 ENCOUNTER — Encounter: Payer: Self-pay | Admitting: Internal Medicine

## 2021-07-08 ENCOUNTER — Ambulatory Visit (INDEPENDENT_AMBULATORY_CARE_PROVIDER_SITE_OTHER): Payer: 59 | Admitting: Internal Medicine

## 2021-07-08 VITALS — BP 118/82 | HR 53 | Temp 98.0°F | Resp 16 | Ht 73.0 in | Wt 215.1 lb

## 2021-07-08 DIAGNOSIS — I10 Essential (primary) hypertension: Secondary | ICD-10-CM

## 2021-07-08 DIAGNOSIS — E785 Hyperlipidemia, unspecified: Secondary | ICD-10-CM | POA: Diagnosis not present

## 2021-07-08 DIAGNOSIS — I7121 Aneurysm of the ascending aorta, without rupture: Secondary | ICD-10-CM

## 2021-07-08 DIAGNOSIS — Z0000 Encounter for general adult medical examination without abnormal findings: Secondary | ICD-10-CM | POA: Diagnosis not present

## 2021-07-08 LAB — CBC WITH DIFFERENTIAL/PLATELET
Basophils Absolute: 0 10*3/uL (ref 0.0–0.1)
Basophils Relative: 0.8 % (ref 0.0–3.0)
Eosinophils Absolute: 0.3 10*3/uL (ref 0.0–0.7)
Eosinophils Relative: 5.5 % — ABNORMAL HIGH (ref 0.0–5.0)
HCT: 44.2 % (ref 39.0–52.0)
Hemoglobin: 14.9 g/dL (ref 13.0–17.0)
Lymphocytes Relative: 31.6 % (ref 12.0–46.0)
Lymphs Abs: 1.5 10*3/uL (ref 0.7–4.0)
MCHC: 33.8 g/dL (ref 30.0–36.0)
MCV: 89.3 fl (ref 78.0–100.0)
Monocytes Absolute: 0.4 10*3/uL (ref 0.1–1.0)
Monocytes Relative: 7.7 % (ref 3.0–12.0)
Neutro Abs: 2.6 10*3/uL (ref 1.4–7.7)
Neutrophils Relative %: 54.4 % (ref 43.0–77.0)
Platelets: 132 10*3/uL — ABNORMAL LOW (ref 150.0–400.0)
RBC: 4.95 Mil/uL (ref 4.22–5.81)
RDW: 13.7 % (ref 11.5–15.5)
WBC: 4.9 10*3/uL (ref 4.0–10.5)

## 2021-07-08 LAB — COMPREHENSIVE METABOLIC PANEL
ALT: 26 U/L (ref 0–53)
AST: 16 U/L (ref 0–37)
Albumin: 4.5 g/dL (ref 3.5–5.2)
Alkaline Phosphatase: 93 U/L (ref 39–117)
BUN: 14 mg/dL (ref 6–23)
CO2: 31 mEq/L (ref 19–32)
Calcium: 9.4 mg/dL (ref 8.4–10.5)
Chloride: 104 mEq/L (ref 96–112)
Creatinine, Ser: 0.95 mg/dL (ref 0.40–1.50)
GFR: 97.74 mL/min (ref >60.00)
Glucose, Bld: 96 mg/dL (ref 70–99)
Potassium: 4.2 mEq/L (ref 3.5–5.1)
Sodium: 140 mEq/L (ref 135–145)
Total Bilirubin: 0.4 mg/dL (ref 0.2–1.2)
Total Protein: 6.2 g/dL (ref 6.0–8.3)

## 2021-07-08 LAB — LIPID PANEL
Cholesterol: 175 mg/dL (ref 0–200)
HDL: 36.9 mg/dL — ABNORMAL LOW (ref >39.00)
LDL Cholesterol: 99 mg/dL (ref 0–99)
NonHDL: 137.83
Total CHOL/HDL Ratio: 5
Triglycerides: 195 mg/dL — ABNORMAL HIGH (ref 0.0–149.0)
VLDL: 39 mg/dL (ref 0.0–40.0)

## 2021-07-08 NOTE — Assessment & Plan Note (Signed)
Here for CPX ?HTN: BP is very good, recommend to check from time to time, continue Amlodipine, losartan, checking labs. ?Blood donor: Has not donated in a while, I think is okay to do if so desire. ?Ascending aortic aneurysm: Per CT 05/2020, cardiology liked to see him every 6 months.  Referral sent.  No symptoms. ?Anxiety: Doing great, wonders if he could just stop medication, we agreed to decrease Lexapro 10 mg to half tablet daily, then decrease if he feels well.  Okay to go back on it if needed. ?RTC 1 year ?

## 2021-07-08 NOTE — Progress Notes (Signed)
? ?Subjective:  ? ? Patient ID: Shawn Webb, male    DOB: [birth year], 46 y.o.   MRN: [id number] ? ?DOS:  07/08/2021 ?Type of visit - description: CPX ?Here for CPX. ?No major concerns. ?Doing well emotionally, decrease Lexapro?[name] Kitchen ?Specifically denies chest pain or difficulty breathing ? ?Review of Systems ? ?Other than above, a 14 point review of systems is negative  ? ?  ? ? ?Past Medical History:  ?Diagnosis Date  ? Allergic rhinitis 11/21/2011  ? Allergy   ? Annual physical exam 11/21/2011  ? Ascending aortic aneurysm 11/29/2019  ? Chest pain of uncertain etiology 99991111  ? Chicken pox   ? Dyslipidemia 08/17/2019  ? Essential hypertension 08/17/2019  ? Hypertension   ? ? ?Past Surgical History:  ?Procedure Laterality Date  ? LUMBAR LAMINECTOMY/DECOMPRESSION MICRODISCECTOMY Left 08/15/2020  ? Procedure: LEFT-SIDED LUMBAR 5 - SACRUM 1 MICRODISECTOMY;  Surgeon: Phylliss Bob, MD;  Location: [facility];  Service: Orthopedics;  Laterality: Left;  ? ?Social History  ? ?Socioeconomic History  ? Marital status: Married  ?  Spouse name: Not on file  ? Number of children: 2  ? Years of education: Not on file  ? Highest education level: Not on file  ?Occupational History  ? Occupation: Pharmacist, hospital, [employer], [employer]  ?Tobacco Use  ? Smoking status: Never  ? Smokeless tobacco: Never  ?Vaping Use  ? Vaping Use: Never used  ?Substance and Sexual Activity  ? Alcohol use: Not Currently  ?  Comment: socially   ? Drug use: Never  ? Sexual activity: Not on file  ?Other Topics Concern  ? Not on file  ?Social History Narrative  ? Children: 2011, 2016   ? Plays outdoor tennis, hiker.  ?  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 ?Social Determinants of Health  ? ?Financial Resource Strain: Not on file  ?Food Insecurity: Not on file  ?Transportation Needs: Not on file  ?Physical Activity: Not on file  ?Stress: Not on file  ?Social Connections: Not on file  ?Intimate Partner Violence: Not on file  ? ? ?Current  Outpatient Medications  ?Medication Instructions  ? amLODipine (NORVASC) 10 mg, Oral, Daily  ? cetirizine (ZYRTEC) 5 mg, Oral, Daily  ? escitalopram (LEXAPRO) 5 mg, Oral, Daily  ? losartan (COZAAR) 50 mg, Oral, Daily, Needs appointment for future refills / 2nd attempt  ? ? ?   ?Objective:  ? Physical Exam ?BP 118/82 (BP Location: Left Arm, Patient Position: Sitting, Cuff Size: Normal)   Pulse (!) 53   Temp 98 ?F (36.7 ?C) (Oral)   Resp 16   Ht 6\' 1"  (1.854 m)   Wt 215 lb 2 oz (97.6 kg)   SpO2 97%   BMI 28.38 kg/m?  ?General: ?Well developed, NAD, BMI noted ?Neck: No  thyromegaly  ?HEENT:  ?Normocephalic . Face symmetric, atraumatic ?Lungs:  ?CTA B ?Normal respiratory effort, no intercostal retractions, no accessory muscle use. ?Heart: RRR,  no murmur.  ?Abdomen:  ?Not distended, soft, non-tender. No rebound or rigidity.   ?Lower extremities: no pretibial edema bilaterally  ?Skin: Exposed areas without rash. Not pale. Not jaundice ?Neurologic:  ?alert & oriented X3.  ?Speech normal, gait appropriate for age and unassisted ?Strength symmetric and appropriate for age.  ?Psych: ?Cognition and judgment appear intact.  ?Cooperative with normal attention span and concentration.  ?Behavior appropriate. ?No anxious or depressed appearing. ? ?   ?Assessment   ? ?  ?Asssessment ?HTN ?Blood donor (q 3  months) ?Mild thrombocytopenia ?Allergic rhinitis ?Rosacea: face ?Eczema (elbows) ?Ascending aortic dilatation, chest pain: (-) Stress test 10-2019.    CT chest 05/2020 : Aneurysm 4.1 cm ? ?PLAN: ?Here for CPX ?HTN: BP is very good, recommend to check from time to time, continue Amlodipine, losartan, checking labs. ?Blood donor: Has not donated in a while, I think is okay to do if so desire. ?Ascending aortic aneurysm: Per CT 05/2020, cardiology liked to see him every 6 months.  Referral sent.  No symptoms. ?Anxiety: Doing great, wonders if he could just stop medication, we agreed to decrease Lexapro 10 mg to half tablet  daily, then decrease if he feels well.  Okay to go back on it if needed. ?RTC 1 year ? ? ?This visit occurred during the SARS-CoV-2 public health emergency.  Safety protocols were in place, including screening questions prior to the visit, additional usage of staff PPE, and extensive cleaning of exam room while observing appropriate contact time as indicated for disinfecting solutions.  ? ?

## 2021-07-08 NOTE — Assessment & Plan Note (Signed)
-  Tdap 2018 ?- C-19 vax: utd  ?- flu shot : utd ?CCS: FH colon polyps M at age 46 y/o; no need for CCS @ this time ?- prostrate ca screening: not indicated  ?-Labs: CMP, FLP, CBC ?-Diet and exercise discussed ?  ? ?  ?

## 2021-07-08 NOTE — Patient Instructions (Signed)
Okay to decrease Lexapro 10 mg to half tablet daily. ?In 3 months okay to stop it if you feel well. ? ?Check the  blood pressure regularly ?BP GOAL is between 110/65 and  135/85. ?If it is consistently higher or lower, let me know ? ?We are referring you to cardiology. ? ?GO TO THE LAB : Get the blood work   ? ? ?[place], [place] ?Come back for a physical exam in 1 year ?

## 2021-07-15 ENCOUNTER — Other Ambulatory Visit: Payer: Self-pay

## 2021-07-15 ENCOUNTER — Ambulatory Visit (INDEPENDENT_AMBULATORY_CARE_PROVIDER_SITE_OTHER): Payer: 59 | Admitting: Cardiology

## 2021-07-15 ENCOUNTER — Encounter: Payer: Self-pay | Admitting: Cardiology

## 2021-07-15 VITALS — BP 126/84 | HR 51 | Ht 73.0 in | Wt 216.0 lb

## 2021-07-15 DIAGNOSIS — R001 Bradycardia, unspecified: Secondary | ICD-10-CM | POA: Diagnosis not present

## 2021-07-15 DIAGNOSIS — I10 Essential (primary) hypertension: Secondary | ICD-10-CM | POA: Diagnosis not present

## 2021-07-15 DIAGNOSIS — I7121 Aneurysm of the ascending aorta, without rupture: Secondary | ICD-10-CM | POA: Diagnosis not present

## 2021-07-15 DIAGNOSIS — E785 Hyperlipidemia, unspecified: Secondary | ICD-10-CM

## 2021-07-15 NOTE — Progress Notes (Signed)
?Cardiology Office Note:   ? ?Date:  07/15/2021  ? ?ID:  Shawn Webb, DOB 10-30-1975, MRN [id number] ? ?PCP:  Wanda Plump, MD  ?Cardiologist:  Gypsy Balsam, MD   ? ?Referring MD: Wanda Plump, MD  ? ?No chief complaint on file. ?Doing fine ? ?History of Present Illness:   ? ?Shawn Webb is a 46 y.o. male with past medical history significant for ascending aortic aneurysm measuring 4.1 cm diagnosed in January 2022, essential hypertension, bradycardia which is not critical.  He comes today 2 months for follow-up.  Overall seems to be doing well, he is asymptomatic, denies have any chest pain tightness squeezing pressure burning chest no palpitations dizziness swelling of lower extremities exercise on the regular basis he likes to hike as well as play tennis. ? ?Past Medical History:  ?Diagnosis Date  ? Allergic rhinitis 11/21/2011  ? Allergy   ? Annual physical exam 11/21/2011  ? Ascending aortic aneurysm 11/29/2019  ? Chest pain of uncertain etiology 08/17/2019  ? Chicken pox   ? Dyslipidemia 08/17/2019  ? Essential hypertension 08/17/2019  ? Hypertension   ? ? ?Past Surgical History:  ?Procedure Laterality Date  ? LUMBAR LAMINECTOMY/DECOMPRESSION MICRODISCECTOMY Left 08/15/2020  ? Procedure: LEFT-SIDED LUMBAR 5 - SACRUM 1 MICRODISECTOMY;  Surgeon: Estill Bamberg, MD;  Location: [facility];  Service: Orthopedics;  Laterality: Left;  ? ? ?Current Medications: ?Current Meds  ?Medication Sig  ? amLODipine (NORVASC) 5 MG tablet Take 2 tablets (10 mg total) by mouth daily.  ? cetirizine (ZYRTEC) 5 MG tablet Take 5 mg by mouth daily.  ? escitalopram (LEXAPRO) 10 MG tablet Take 0.5 tablets (5 mg total) by mouth daily.  ? losartan (COZAAR) 50 MG tablet Take 1 tablet (50 mg total) by mouth daily. Needs appointment for future refills / 2nd attempt  ?  ? ?Allergies:   Sulfa antibiotics  ? ?Social History  ? ?Socioeconomic History  ? Marital status: Married  ?  Spouse name: Not on file  ? Number of children: 2  ? Years of  education: Not on file  ? Highest education level: Not on file  ?Occupational History  ? Occupation: Runner, broadcasting/film/video, [employer], [employer]  ?Tobacco Use  ? Smoking status: Never  ?  Passive exposure: Never  ? Smokeless tobacco: Never  ?Vaping Use  ? Vaping Use: Never used  ?Substance and Sexual Activity  ? Alcohol use: Not Currently  ?  Comment: socially   ? Drug use: Never  ? Sexual activity: Not on file  ?Other Topics Concern  ? Not on file  ?Social History Narrative  ? Children: 2011, 2016   ? Plays outdoor tennis, hiker.  ?  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 ?Social Determinants of Health  ? ?Financial Resource Strain: Not on file  ?Food Insecurity: Not on file  ?Transportation Needs: Not on file  ?Physical Activity: Not on file  ?Stress: Not on file  ?Social Connections: Not on file  ?  ? ?Family History: ?The patient's family history includes Colon cancer (age of onset: 44) in his maternal grandfather; Colon polyps (age of onset: 79) in his mother. There is no history of CAD, Diabetes, or Prostate cancer. ?ROS:   ?Please see the history of present illness.    ?All 14 point review of systems negative except as described per history of present illness ? ?EKGs/Labs/Other Studies Reviewed:   ? ? ? ?Recent Labs: ?07/08/2021: ALT 26; BUN 14; Creatinine, Ser  0.95; Hemoglobin 14.9; Platelets 132.0; Potassium 4.2; Sodium 140  ?Recent Lipid Panel ?   ?Component Value Date/Time  ? CHOL 175 07/08/2021 0837  ? TRIG 195.0 (H) 07/08/2021 0837  ? HDL 36.90 (L) 07/08/2021 0837  ? CHOLHDL 5 07/08/2021 0837  ? VLDL 39.0 07/08/2021 0837  ? LDLCALC 99 07/08/2021 0837  ? LDLDIRECT 160.0 05/05/2016 1554  ? ? ?Physical Exam:   ? ?VS:  BP 126/84 (BP Location: Right Arm)   Pulse (!) 51   Ht 6\' 1"  (1.854 m)   Wt 216 lb (98 kg)   SpO2 98%   BMI 28.50 kg/m?    ? ?Wt Readings from Last 3 Encounters:  ?07/15/21 216 lb (98 kg)  ?07/08/21 215 lb 2 oz (97.6 kg)  ?12/19/20 221 lb 8 oz (100.5 kg)  ?  ? ?GEN:  Well  nourished, well developed in no acute distress ?HEENT: Normal ?NECK: No JVD; No carotid bruits ?LYMPHATICS: No lymphadenopathy ?CARDIAC: RRR, no murmurs, no rubs, no gallops ?RESPIRATORY:  Clear to auscultation without rales, wheezing or rhonchi  ?ABDOMEN: Soft, non-tender, non-distended ?MUSCULOSKELETAL:  No edema; No deformity  ?SKIN: Warm and dry ?LOWER EXTREMITIES: no swelling ?NEUROLOGIC:  Alert and oriented x 3 ?PSYCHIATRIC:  Normal affect  ? ?ASSESSMENT:   ? ?1. Aneurysm of ascending aorta without rupture   ?2. Dyslipidemia   ?3. Bradycardia   ?4. Essential hypertension   ? ?PLAN:   ? ?In order of problems listed above: ? ?Aneurysm of the ascending aorta measuring 41 mm a year ago.  I will ask him to have an echocardiogram to check stability of the aneurysm.  If everything is stable then we do every 3 to 5 years evaluation.  Likely this is a stenting portion of the aorta therefore we will be able to see it just from ultrasound testing.  I will schedule him to have abdominal ultrasound to make sure he does not have any aneurysmal deformation of his abdominal aorta.  He is relatively young to have aneurysm.  Will consider genetic testing. ?Dyslipidemia I did review his K PN which show me his LDL of 99 HDL 36.9 this is from July 08, 2021.  Continue present management. ?Essential hypertension excellently controlled blood pressure at 126/84 amlodipine and losartan which we will continue. ?Bradycardia I did review his monitor which was done just few months ago showed some bradycardia but good chronotropic response and asymptomatic.  Mechanism of bradycardia sinus ? ? ?Medication Adjustments/Labs and Tests Ordered: ?Current medicines are reviewed at length with the patient today.  Concerns regarding medicines are outlined above.  ?No orders of the defined types were placed in this encounter. ? ?Medication changes: No orders of the defined types were placed in this encounter. ? ? ?Signed, ?July 10, 2021, MD,  [facility] ?07/15/2021 2:02 PM    ?[facility] ?

## 2021-07-15 NOTE — Patient Instructions (Addendum)
Medication Instructions:  ?Your physician recommends that you continue on your current medications as directed. Please refer to the Current Medication list given to you today. ? ?*If you need a refill on your cardiac medications before your next appointment, please call your pharmacy* ? ? ?Lab Work: ?None ?If you have labs (blood work) drawn today and your tests are completely normal, you will receive your results only by: ?MyChart Message (if you have MyChart) OR ?A paper copy in the mail ?If you have any lab test that is abnormal or we need to change your treatment, we will call you to review the results. ? ? ?Testing/Procedures: ?Your physician has requested that you have an echocardiogram. Echocardiography is a painless test that uses sound waves to create images of your heart. It provides your doctor with information about the size and shape of your heart and how well your heart?s chambers and valves are working. This procedure takes approximately one hour. There are no restrictions for this procedure.  ? ?Your physician has requested that you have an abdominal aorta duplex. During this test, an ultrasound is used to evaluate the aorta. Allow 30 minutes for this exam. Do not eat after midnight the day before and avoid carbonated beverages  ? ? ?Follow-Up: ?At [facility], you and your health needs are our priority.  As part of our continuing mission to provide you with exceptional heart care, we have created designated Provider Care Teams.  These Care Teams include your primary Cardiologist (physician) and Advanced Practice Providers (APPs -  Physician Assistants and Nurse Practitioners) who all work together to provide you with the care you need, when you need it. ? ?We recommend signing up for the patient portal called "MyChart".  Sign up information is provided on this After Visit Summary.  MyChart is used to connect with patients for Virtual Visits (Telemedicine).  Patients are able to view lab/test  results, encounter notes, upcoming appointments, etc.  Non-urgent messages can be sent to your provider as well.   ?To learn more about what you can do with MyChart, go to [email or web address].   ? ?Your next appointment:   ?1 year(s) ? ?The format for your next appointment:   ?In Person ? ?Provider:   ?Gypsy Balsam, MD  ? ? ?Other Instructions ?None ? ?

## 2021-07-16 ENCOUNTER — Telehealth (HOSPITAL_BASED_OUTPATIENT_CLINIC_OR_DEPARTMENT_OTHER): Payer: Self-pay

## 2021-07-18 ENCOUNTER — Telehealth (HOSPITAL_BASED_OUTPATIENT_CLINIC_OR_DEPARTMENT_OTHER): Payer: Self-pay

## 2021-07-29 ENCOUNTER — Ambulatory Visit (HOSPITAL_BASED_OUTPATIENT_CLINIC_OR_DEPARTMENT_OTHER)
Admission: RE | Admit: 2021-07-29 | Discharge: 2021-07-29 | Disposition: A | Discharge status: Home / Self Care | Payer: Commercial Managed Care - PPO | Source: Ambulatory Visit | Attending: Cardiology | Admitting: Cardiology

## 2021-07-29 DIAGNOSIS — E785 Hyperlipidemia, unspecified: Secondary | ICD-10-CM | POA: Insufficient documentation

## 2021-07-29 DIAGNOSIS — I10 Essential (primary) hypertension: Secondary | ICD-10-CM | POA: Diagnosis present

## 2021-07-29 DIAGNOSIS — R001 Bradycardia, unspecified: Secondary | ICD-10-CM | POA: Diagnosis present

## 2021-07-29 DIAGNOSIS — I7121 Aneurysm of the ascending aorta, without rupture: Secondary | ICD-10-CM | POA: Diagnosis present

## 2021-07-29 LAB — ECHOCARDIOGRAM COMPLETE
AV Mean grad: 6 mmHg
AV Peak grad: 10.5 mmHg
Ao pk vel: 1.62 m/s
Area-P 1/2: 2.92 cm2
S' Lateral: 3.4 cm

## 2021-07-29 NOTE — Progress Notes (Signed)
?  Echocardiogram ?2D Echocardiogram has been performed. ? ?Shawn Webb ?07/29/2021, 5:02 PM ?

## 2021-08-01 ENCOUNTER — Telehealth: Payer: Self-pay | Admitting: Cardiology

## 2021-08-01 NOTE — Telephone Encounter (Signed)
° °  Pt is returning call to get echo result °

## 2021-08-01 NOTE — Telephone Encounter (Signed)
Spoke with pt regarding test results- per Dr. Vanetta Shawl notes. ?

## 2021-08-06 ENCOUNTER — Other Ambulatory Visit (HOSPITAL_BASED_OUTPATIENT_CLINIC_OR_DEPARTMENT_OTHER): Payer: 59

## 2021-08-11 ENCOUNTER — Other Ambulatory Visit: Payer: Self-pay | Admitting: Cardiology

## 2021-09-18 ENCOUNTER — Other Ambulatory Visit: Payer: Self-pay | Admitting: Internal Medicine

## 2021-09-19 ENCOUNTER — Other Ambulatory Visit: Payer: Self-pay | Admitting: Internal Medicine

## 2021-11-08 ENCOUNTER — Encounter: Payer: Self-pay | Admitting: Internal Medicine

## 2022-01-13 IMAGING — CR DG LUMBAR SPINE 2-3V
2 series · 2 of 2 positions shown · non-contrast
Comparison: Portable cross-table lateral intraoperative images
compared to MRI lumbar spine of 06/17/2020

CLINICAL DATA: Lumbar spine surgery

EXAM:
LUMBAR SPINE - 2-3 VIEW

[lateral (1 of 2)]
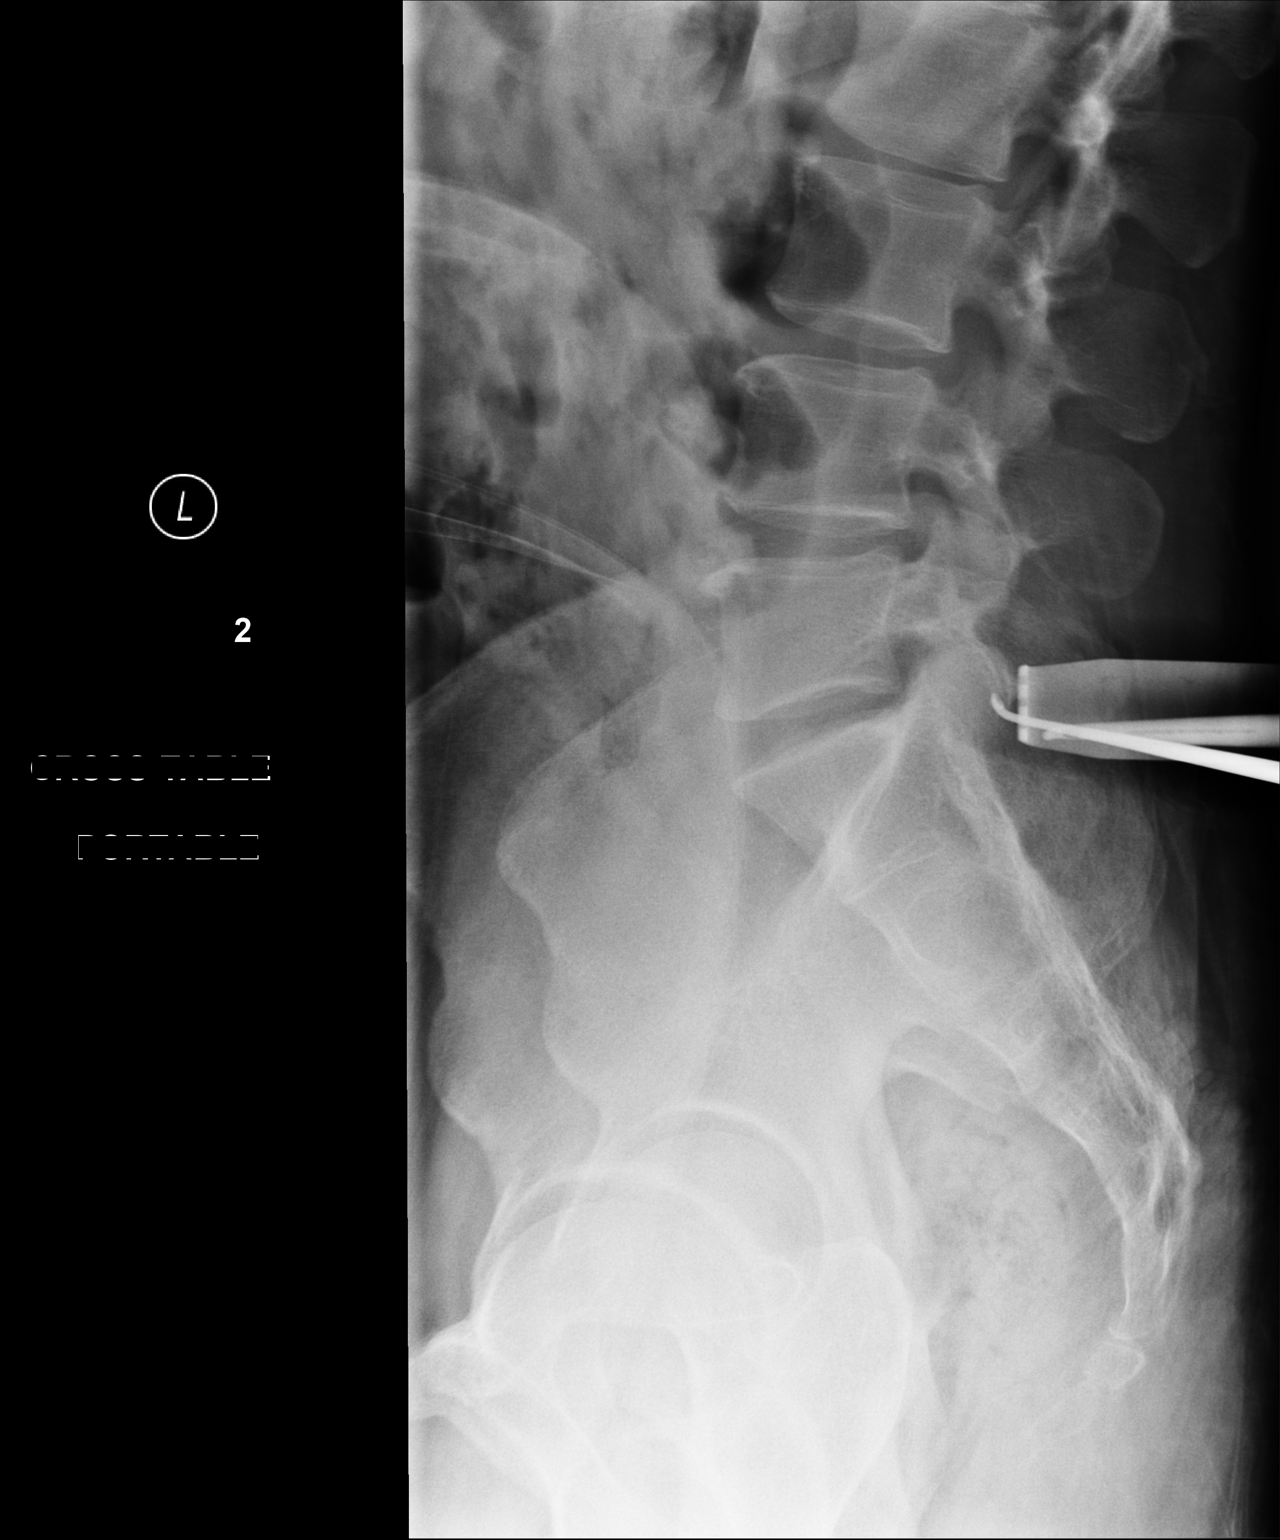

[lateral (2 of 2)]
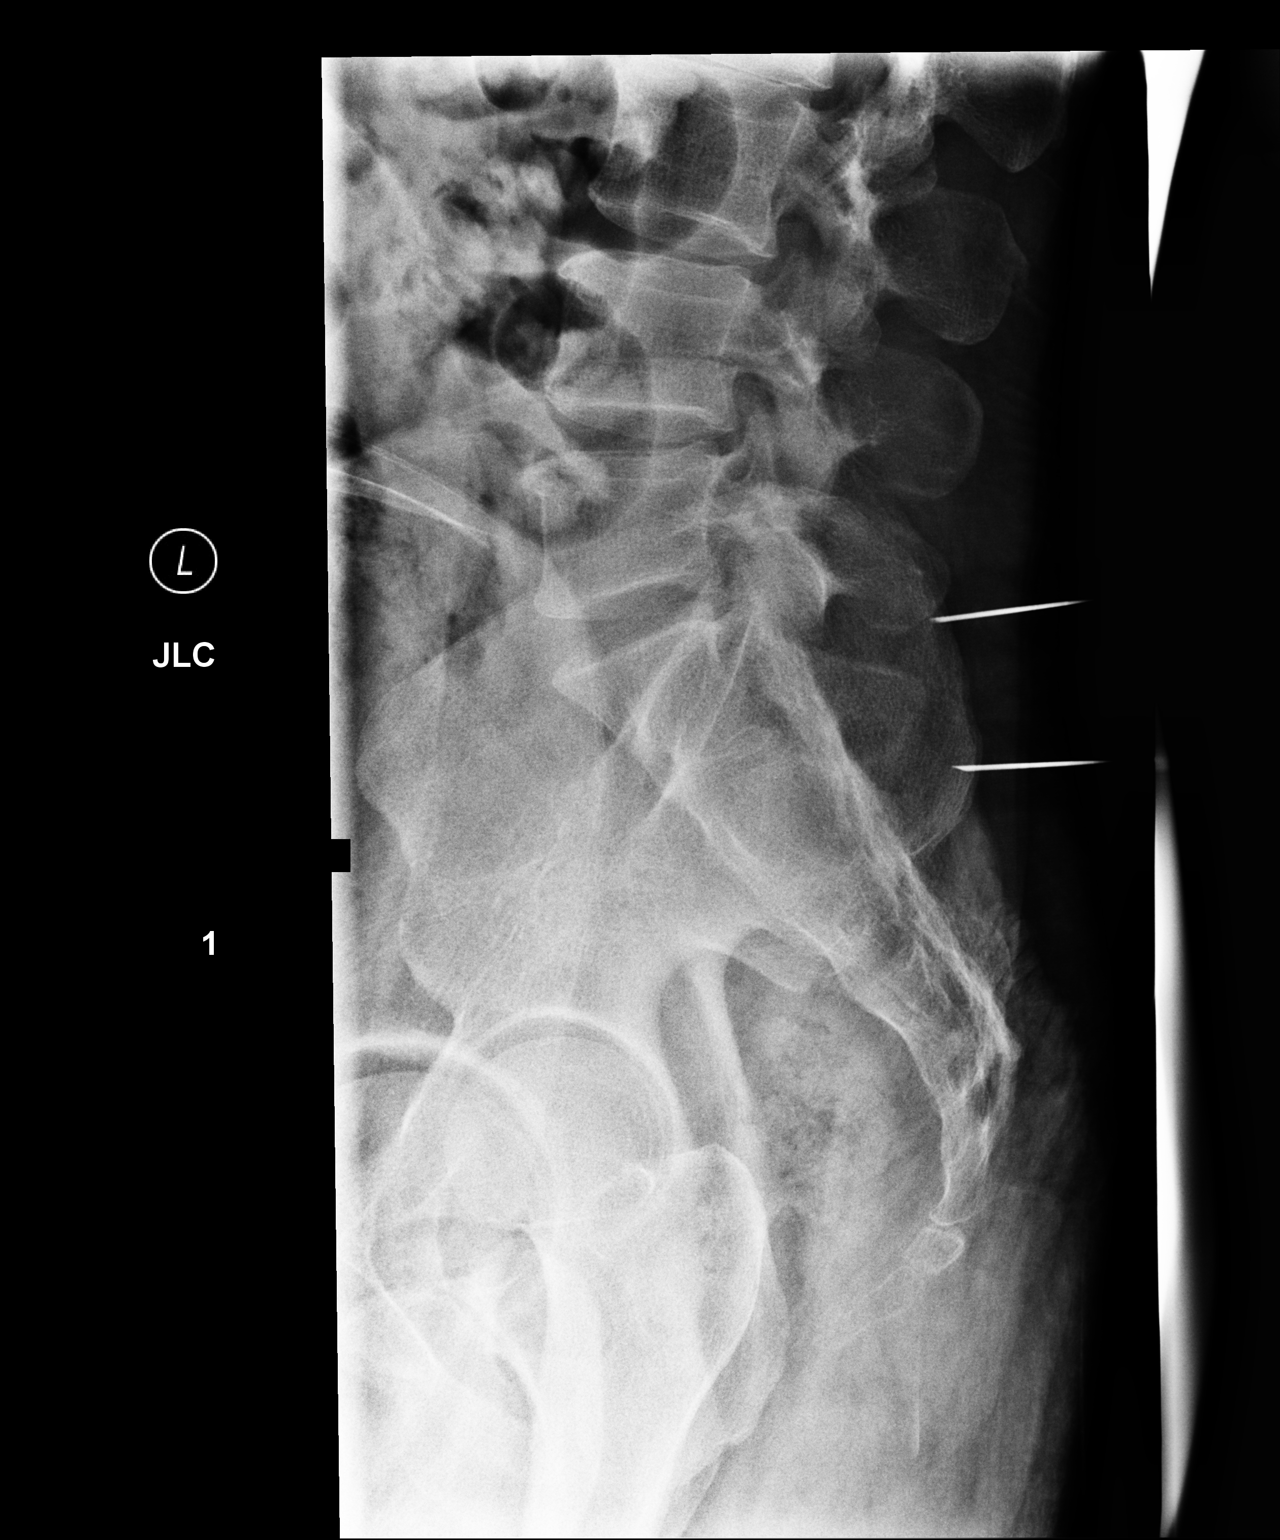

[2 of 2 positions shown; findings below may reference images not displayed]

FINDINGS: Prior MRI labeled with 5 lumbar type vertebra.

dorsal to the L5-S1 disc space and dorsal to the S1-S2 junction of
the sacrum.

Image #2 @ 6589 hours: Curved metallic probe via dorsal approach
projects dorsal to the L5-S1 disc space level.
IMPRESSION: Intraoperative lumbar localization images as above.

## 2022-05-30 ENCOUNTER — Other Ambulatory Visit: Payer: Self-pay | Admitting: Internal Medicine

## 2022-07-10 ENCOUNTER — Ambulatory Visit: Payer: Commercial Managed Care - PPO | Admitting: Internal Medicine

## 2022-07-10 ENCOUNTER — Encounter: Payer: Self-pay | Admitting: Internal Medicine

## 2022-07-10 VITALS — BP 126/80 | HR 57 | Temp 98.1°F | Resp 16 | Ht 73.0 in | Wt 227.5 lb

## 2022-07-10 DIAGNOSIS — Z1211 Encounter for screening for malignant neoplasm of colon: Secondary | ICD-10-CM

## 2022-07-10 DIAGNOSIS — Z83719 Family history of colon polyps, unspecified: Secondary | ICD-10-CM

## 2022-07-10 DIAGNOSIS — E785 Hyperlipidemia, unspecified: Secondary | ICD-10-CM

## 2022-07-10 DIAGNOSIS — I10 Essential (primary) hypertension: Secondary | ICD-10-CM

## 2022-07-10 DIAGNOSIS — Z0000 Encounter for general adult medical examination without abnormal findings: Secondary | ICD-10-CM | POA: Diagnosis not present

## 2022-07-10 LAB — COMPREHENSIVE METABOLIC PANEL
ALT: 34 U/L (ref 0–53)
AST: 19 U/L (ref 0–37)
Albumin: 4.3 g/dL (ref 3.5–5.2)
Alkaline Phosphatase: 94 U/L (ref 39–117)
BUN: 15 mg/dL (ref 6–23)
CO2: 28 mEq/L (ref 19–32)
Calcium: 9.4 mg/dL (ref 8.4–10.5)
Chloride: 104 mEq/L (ref 96–112)
Creatinine, Ser: 1 mg/dL (ref 0.40–1.50)
GFR: 91.26 mL/min (ref >60.00)
Glucose, Bld: 86 mg/dL (ref 70–99)
Potassium: 4.3 mEq/L (ref 3.5–5.1)
Sodium: 139 mEq/L (ref 135–145)
Total Bilirubin: 0.6 mg/dL (ref 0.2–1.2)
Total Protein: 6.7 g/dL (ref 6.0–8.3)

## 2022-07-10 LAB — CBC WITH DIFFERENTIAL/PLATELET
Basophils Absolute: 0 10*3/uL (ref 0.0–0.1)
Basophils Relative: 0.8 % (ref 0.0–3.0)
Eosinophils Absolute: 0.2 10*3/uL (ref 0.0–0.7)
Eosinophils Relative: 3.5 % (ref 0.0–5.0)
HCT: 45.5 % (ref 39.0–52.0)
Hemoglobin: 15.6 g/dL (ref 13.0–17.0)
Lymphocytes Relative: 35.4 % (ref 12.0–46.0)
Lymphs Abs: 1.7 10*3/uL (ref 0.7–4.0)
MCHC: 34.4 g/dL (ref 30.0–36.0)
MCV: 88 fl (ref 78.0–100.0)
Monocytes Absolute: 0.4 10*3/uL (ref 0.1–1.0)
Monocytes Relative: 7.9 % (ref 3.0–12.0)
Neutro Abs: 2.5 10*3/uL (ref 1.4–7.7)
Neutrophils Relative %: 52.4 % (ref 43.0–77.0)
Platelets: 151 10*3/uL (ref 150.0–400.0)
RBC: 5.17 Mil/uL (ref 4.22–5.81)
RDW: 13.5 % (ref 11.5–15.5)
WBC: 4.8 10*3/uL (ref 4.0–10.5)

## 2022-07-10 LAB — LIPID PANEL
Cholesterol: 199 mg/dL (ref 0–200)
HDL: 41.6 mg/dL (ref >39.00)
LDL Cholesterol: 131 mg/dL — ABNORMAL HIGH (ref 0–99)
NonHDL: 157.06
Total CHOL/HDL Ratio: 5
Triglycerides: 129 mg/dL (ref 0.0–149.0)
VLDL: 25.8 mg/dL (ref 0.0–40.0)

## 2022-07-10 LAB — TSH: TSH: 1.36 u[IU]/mL (ref 0.35–5.50)

## 2022-07-10 MED ORDER — BETAMETHASONE DIPROPIONATE AUG 0.05 % EX CREA
TOPICAL_CREAM | Freq: Two times a day (BID) | CUTANEOUS | 3 refills | Status: DC
Start: 2022-07-10 — End: 2023-03-31

## 2022-07-10 NOTE — Patient Instructions (Addendum)
Check the  blood pressure regularly BP GOAL is between 110/65 and  135/85. If it is consistently higher or lower, let me know  Reach out to gastroenterology to schedule a colonoscopy [phone]  GO TO THE LAB : Get the blood work     [place], [place] back for a physical exam in 1 year

## 2022-07-10 NOTE — Assessment & Plan Note (Signed)
Here for CPX Chronic medical problems reviewed. HTN: BP is good, recommend to check from time to time, continue amlodipine, losartan. Eczema: Ongoing, on and off, see picture.  Refill steroid cream and take OTC antihistaminic as needed Ascending aortic aneurysm, saw cardiology a year ago, echo was okay, was rec to recheck every 3 to 5 years. Was Rx a abdominal aorta US, patient did not proceed, not interested in pursuing.  Asymptomatic. Anxiety: Self resolved, not needing SSRIs. RTC 1 year

## 2022-07-10 NOTE — Assessment & Plan Note (Signed)
-  Tdap 2018 - C-19 vax: utd  - flu shot : utd CCS: FH colon polyps M at age 47 y/o; requested screening, referred to GI. - prostrate ca screening: not indicated  -Labs: CMP FLP CBC TSH -Diet and exercise: Doing well, very active, hiking frequently.

## 2022-07-10 NOTE — Progress Notes (Signed)
Subjective:    Patient ID: Shawn Webb, male    DOB: [birth year], 47 y.o.   MRN: [id number]  DOS:  07/10/2022 Type of visit - description: cpx  Since the last office visit is doing well. Saw cardiology, notes reviewed. Still has rash on and off on the arms.  + Pruritic.  Review of Systems  Other than above, a 14 point review of systems is negative    Past Medical History:  Diagnosis Date   Allergic rhinitis 11/21/2011   Allergy    Annual physical exam 11/21/2011   Ascending aortic aneurysm ([place]) 11/29/2019   Chest pain of uncertain etiology 99991111   Chicken pox    Dyslipidemia 08/17/2019   Essential hypertension 08/17/2019   Hypertension     Past Surgical History:  Procedure Laterality Date   LUMBAR LAMINECTOMY/DECOMPRESSION MICRODISCECTOMY Left 08/15/2020   Procedure: LEFT-SIDED LUMBAR 5 - SACRUM 1 MICRODISECTOMY;  Surgeon: Phylliss Bob, MD;  Location: [facility];  Service: Orthopedics;  Laterality: Left;   Social History   Socioeconomic History   Marital status: Married    Spouse name: Not on file   Number of children: 2   Years of education: Not on file   Highest education level: Not on file  Occupational History   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       Social Determinants of Health   Financial Resource Strain: Not on file  Food Insecurity: Not on file  Transportation Needs: Not on file  Physical Activity: Not on file  Stress: Not on file  Social Connections: Not on file  Intimate Partner Violence: Not on file    Current Outpatient Medications  Medication  Instructions   amLODipine (NORVASC) 10 mg, Oral, Daily   augmented betamethasone dipropionate (DIPROLENE-AF) 0.05 % cream Topical, 2 times daily   cetirizine (ZYRTEC) 5 mg, Oral, Daily   losartan (COZAAR) 50 MG tablet TAKE 1 TABLET BY MOUTH DAILY       Objective:   Physical Exam BP 126/80   Pulse (!) 57   Temp 98.1 F (36.7 C) (Oral)   Resp 16   Ht '6\' 1"'$  (1.854 m)   Wt 227 lb 8 oz (103.2 kg)   SpO2 98%   BMI 30.02 kg/m  General: Well developed, NAD, BMI noted Neck: No  thyromegaly  HEENT:  Normocephalic . Face symmetric, atraumatic Lungs:  CTA B Normal respiratory effort, no intercostal retractions, no accessory muscle use. Heart: RRR,  no murmur.  Abdomen:  Not distended, soft, non-tender. No rebound or rigidity.  No mass, no bruit. Lower extremities: no pretibial edema bilaterally  Skin: Rash, upper extremities, see picture Neurologic:  alert & oriented X3.  Speech normal, gait appropriate for age and unassisted Strength symmetric and appropriate for age.  Psych: Cognition and judgment appear intact.  Cooperative with normal attention span and concentration.  Behavior appropriate. No anxious or depressed appearing.     Assessment      Asssessment HTN Mild thrombocytopenia Allergic rhinitis Rosacea: face Eczema (elbows) CV: --Ascending aortic  dilatation, chest pain: (-) Stress test 10-2019.     --CT chest 05/2020 : Aneurysm 4.1 cm  PLAN: Here for CPX Chronic medical problems reviewed. HTN: BP is good, recommend to check from time to time, continue amlodipine, losartan. Eczema: Ongoing, on and off, see picture.  Refill steroid cream and take OTC antihistaminic as needed Ascending aortic aneurysm, saw cardiology a year ago, echo was okay, was rec to recheck every 3 to 5 years. Was Rx a abdominal aorta US, patient did not proceed, not interested in pursuing.  Asymptomatic. Anxiety: Self resolved, not needing SSRIs. RTC 1 year

## 2022-09-25 ENCOUNTER — Other Ambulatory Visit: Payer: Self-pay | Admitting: Cardiology

## 2022-10-01 ENCOUNTER — Encounter: Payer: Self-pay | Admitting: Gastroenterology

## 2022-10-24 ENCOUNTER — Ambulatory Visit (AMBULATORY_SURGERY_CENTER): Payer: Commercial Managed Care - PPO

## 2022-10-24 ENCOUNTER — Other Ambulatory Visit: Payer: Self-pay

## 2022-10-24 VITALS — Ht 73.0 in | Wt 225.0 lb

## 2022-10-24 DIAGNOSIS — Z1211 Encounter for screening for malignant neoplasm of colon: Secondary | ICD-10-CM

## 2022-10-24 MED ORDER — NA SULFATE-K SULFATE-MG SULF 17.5-3.13-1.6 GM/177ML PO SOLN: 1.0000 | Freq: Once | ORAL | 0 refills | Status: AC

## 2022-10-24 NOTE — Progress Notes (Signed)
Denies allergies to eggs or soy products. Denies complication of anesthesia or sedation. Denies use of weight loss medication. Denies use of O2.   [facility]i instructions given for colonoscopy.  

## 2022-11-10 ENCOUNTER — Encounter: Payer: Self-pay | Admitting: Internal Medicine

## 2022-11-11 ENCOUNTER — Encounter: Payer: Self-pay | Admitting: Gastroenterology

## 2022-11-12 ENCOUNTER — Other Ambulatory Visit: Payer: Self-pay | Admitting: Internal Medicine

## 2022-11-12 ENCOUNTER — Encounter: Payer: Commercial Managed Care - PPO | Admitting: Gastroenterology

## 2022-11-12 ENCOUNTER — Telehealth: Payer: Self-pay

## 2022-11-12 NOTE — Telephone Encounter (Signed)
Physical form completed and faxed back to [facility] at [phone]. Form sent for scanning.     Received fax confirmation.

## 2022-11-13 ENCOUNTER — Other Ambulatory Visit: Payer: Self-pay | Admitting: Cardiology

## 2022-11-20 ENCOUNTER — Encounter: Payer: Self-pay | Admitting: Gastroenterology

## 2022-11-20 ENCOUNTER — Ambulatory Visit (AMBULATORY_SURGERY_CENTER): Payer: Commercial Managed Care - PPO | Admitting: Gastroenterology

## 2022-11-20 VITALS — BP 113/77 | HR 51 | Temp 98.9°F | Resp 15 | Ht 73.0 in | Wt 225.0 lb

## 2022-11-20 DIAGNOSIS — D122 Benign neoplasm of ascending colon: Secondary | ICD-10-CM

## 2022-11-20 DIAGNOSIS — Z1211 Encounter for screening for malignant neoplasm of colon: Secondary | ICD-10-CM

## 2022-11-20 MED ORDER — SODIUM CHLORIDE 0.9 % IV SOLN: 500.0000 mL | Freq: Once | INTRAVENOUS | Status: DC

## 2022-11-20 NOTE — Progress Notes (Signed)
History & Physical  Primary Care Physician:  Wanda Plump, MD Primary Gastroenterologist: Claudette Head, MD  Impression / Plan:  Average risk CRC screening for colonoscopy.  CHIEF COMPLAINT:  CRC screening   HPI: Shawn Webb is a 47 y.o. male average risk CRC screening for colonoscopy.    Past Medical History:  Diagnosis Date   Allergic rhinitis 11/21/2011   Allergy    Annual physical exam 11/21/2011   Ascending aortic aneurysm (HCC) 11/29/2019   Chest pain of uncertain etiology 08/17/2019   Chicken pox    Dyslipidemia 08/17/2019   Essential hypertension 08/17/2019   GERD (gastroesophageal reflux disease)    Hypertension     Past Surgical History:  Procedure Laterality Date   LUMBAR LAMINECTOMY/DECOMPRESSION MICRODISCECTOMY Left 08/15/2020   Procedure: LEFT-SIDED LUMBAR 5 - SACRUM 1 MICRODISECTOMY;  Surgeon: Estill Bamberg, MD;  Location: [facility] OR;  Service: Orthopedics;  Laterality: Left;    Prior to Admission medications   Medication Sig Start Date End Date Taking? Authorizing Provider  amLODipine (NORVASC) 5 MG tablet Take 2 tablets (10 mg total) by mouth daily. 11/13/22  Yes Paz, Nolon Rod, MD  cetirizine (ZYRTEC) 5 MG tablet Take 5 mg by mouth daily.   Yes [provider]  losartan (COZAAR) 50 MG tablet Take 1 tablet (50 mg total) by mouth daily. Patient needs appointment for further refills. 1 st attempt 09/26/22  Yes Georgeanna Lea, MD  augmented betamethasone dipropionate (DIPROLENE-AF) 0.05 % cream Apply topically 2 (two) times daily. Patient not taking: Reported on 11/20/2022 07/10/22   Wanda Plump, MD    Current Outpatient Medications  Medication Sig Dispense Refill   amLODipine (NORVASC) 5 MG tablet Take 2 tablets (10 mg total) by mouth daily. 180 tablet 2   cetirizine (ZYRTEC) 5 MG tablet Take 5 mg by mouth daily.     losartan (COZAAR) 50 MG tablet Take 1 tablet (50 mg total) by mouth daily. Patient needs appointment for further refills. 1 st  attempt 30 tablet 0   augmented betamethasone dipropionate (DIPROLENE-AF) 0.05 % cream Apply topically 2 (two) times daily. (Patient not taking: Reported on 11/20/2022) 30 g 3   Current Facility-Administered Medications  Medication Dose Route Frequency Provider Last Rate Last Admin   0.9 %  sodium chloride infusion  500 mL Intravenous Once Meryl Dare, MD        Allergies as of 11/20/2022 - Review Complete 11/20/2022  Allergen Reaction Noted   Sulfa antibiotics Other (See Comments) 11/21/2011    Family History  Problem Relation Age of Onset   Colon polyps Mother 16   Colon polyps Maternal Grandfather    CAD Neg Hx    Diabetes Neg Hx    Prostate cancer Neg Hx    Colon cancer Neg Hx    Esophageal cancer Neg Hx    Rectal cancer Neg Hx    Stomach cancer Neg Hx     Social History   Socioeconomic History   Marital status: Married    Spouse name: Not on file   Number of children: 2   Years of education: Not on file   Highest education level: Not on file  Occupational History   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       Social Determinants of Health   Financial Resource Strain: Not on file  Food Insecurity: Not on file  Transportation Needs: Not on file  Physical Activity: Not on file  Stress: Not on file  Social Connections: Not on file  Intimate Partner Violence: Not on file    Review of Systems:  All systems reviewed were negative except where noted in HPI.   Physical Exam:  General:  Alert, well-developed, in NAD Head:  Normocephalic and atraumatic. Eyes:  Sclera clear, no  icterus.   Conjunctiva pink. Ears:  Normal auditory acuity. Mouth:  No deformity or lesions.  Neck:  Supple; no masses. Lungs:  Clear throughout to auscultation.   No wheezes, crackles, or rhonchi.  Heart:  Regular rate and rhythm; no murmurs. Abdomen:  Soft, nondistended, nontender. No masses, hepatomegaly. No palpable masses.  Normal bowel sounds.    Rectal:  Deferred   Msk:  Symmetrical without gross deformities. Extremities:  Without edema. Neurologic:  Alert and  oriented x 4; grossly normal neurologically. Skin:  Intact without significant lesions or rashes. Psych:  Alert and cooperative. Normal mood and affect.   Venita Lick. Russella Dar  11/20/2022, 10:08 AM See Loretha Stapler, [facility], to contact our on call provider

## 2022-11-20 NOTE — Patient Instructions (Addendum)
Handouts provided about polyps, high fiber diet and diverticulosis.  Resume previous diet. Increase fiber in your diet.  Continue present medications. Await pathology results.   YOU HAD AN ENDOSCOPIC PROCEDURE TODAY AT THE [facility]:   Refer to the procedure report that was given to you for any specific questions about what was found during the examination.  If the procedure report does not answer your questions, please call your gastroenterologist to clarify.  If you requested that your care partner not be given the details of your procedure findings, then the procedure report has been included in a sealed envelope for you to review at your convenience later.  YOU SHOULD EXPECT: Some feelings of bloating in the abdomen. Passage of more gas than usual.  Walking can help get rid of the air that was put into your GI tract during the procedure and reduce the bloating. If you had a lower endoscopy (such as a colonoscopy or flexible sigmoidoscopy) you may notice spotting of blood in your stool or on the toilet paper. If you underwent a bowel prep for your procedure, you may not have a normal bowel movement for a few days.  Please Note:  You might notice some irritation and congestion in your nose or some drainage.  This is from the oxygen used during your procedure.  There is no need for concern and it should clear up in a day or so.  SYMPTOMS TO REPORT IMMEDIATELY:  Following lower endoscopy (colonoscopy or flexible sigmoidoscopy):  Excessive amounts of blood in the stool  Significant tenderness or worsening of abdominal pains  Swelling of the abdomen that is new, acute  Fever of 100F or higher  For urgent or emergent issues, a gastroenterologist can be reached at any hour by calling [phone]. Do not use MyChart messaging for urgent concerns.    DIET:  We do recommend a small meal at first, but then you may proceed to your regular diet.  Drink plenty of fluids but you should  avoid alcoholic beverages for 24 hours.  ACTIVITY:  You should plan to take it easy for the rest of today and you should NOT DRIVE or use heavy machinery until tomorrow (because of the sedation medicines used during the test).    FOLLOW UP: Our staff will call the number listed on your records the next business day following your procedure.  We will call around 7:15- 8:00 am to check on you and address any questions or concerns that you may have regarding the information given to you following your procedure. If we do not reach you, we will leave a message.     If any biopsies were taken you will be contacted by phone or by letter within the next 1-3 weeks.  Please call us at [phone] if you have not heard about the biopsies in 3 weeks.    SIGNATURES/CONFIDENTIALITY: You and/or your care partner have signed paperwork which will be entered into your electronic medical record.  These signatures attest to the fact that that the information above on your After Visit Summary has been reviewed and is understood.  Full responsibility of the confidentiality of this discharge information lies with you and/or your care-partner.

## 2022-11-20 NOTE — Op Note (Signed)
[facility] Patient Name: Shawn Webb Procedure Date: 11/20/2022 10:11 AM MRN: [id number] Endoscopist: Meryl Dare , MD, [id number] Age: 47 Referring MD:  Date of Birth: 02-20-1976 Gender: Male Account #: [id number] Procedure:                Colonoscopy Indications:              Screening for colorectal malignant neoplasm Medicines:                Monitored Anesthesia Care Procedure:                Pre-Anesthesia Assessment:                           - Prior to the procedure, a History and Physical                            was performed, and patient medications and                            allergies were reviewed. The patient's tolerance of                            previous anesthesia was also reviewed. The risks                            and benefits of the procedure and the sedation                            options and risks were discussed with the patient.                            All questions were answered, and informed consent                            was obtained. Prior Anticoagulants: The patient has                            taken no anticoagulant or antiplatelet agents. ASA                            Grade Assessment: II - A patient with mild systemic                            disease. After reviewing the risks and benefits,                            the patient was deemed in satisfactory condition to                            undergo the procedure.                           After obtaining informed consent, the colonoscope  was passed under direct vision. Throughout the                            procedure, the patient's blood pressure, pulse, and                            oxygen saturations were monitored continuously. The                            CF [id number] #[id number] was introduced through the anus                            and advanced to the the cecum, identified by                            appendiceal  orifice and ileocecal valve. The                            ileocecal valve, appendiceal orifice, and rectum                            were photographed. The quality of the bowel                            preparation was good. The colonoscopy was performed                            without difficulty. The patient tolerated the                            procedure well. Scope In: 10:18:14 AM Scope Out: 10:33:40 AM Scope Withdrawal Time: 0 hours 11 minutes 22 seconds  Total Procedure Duration: 0 hours 15 minutes 26 seconds  Findings:                 The perianal and digital rectal examinations were                            normal.                           A 4 mm polyp was found in the ascending colon. The                            polyp was sessile. The polyp was removed with a                            cold biopsy forceps. Resection and retrieval were                            complete.                           A few small-mouthed diverticula were found in the  left colon.                           External and internal hemorrhoids were found during                            retroflexion. The hemorrhoids were small and Grade                            I (internal hemorrhoids that do not prolapse).                           The exam was otherwise without abnormality on                            direct and retroflexion views. Complications:            No immediate complications. Estimated blood loss:                            None. Estimated Blood Loss:     Estimated blood loss: none. Impression:               - One 4 mm polyp in the ascending colon, removed                            with a cold biopsy forceps. Resected and retrieved.                           - Mild diverticulosis in the left colon.                           - External and internal hemorrhoids.                           - The examination was otherwise normal on direct                             and retroflexion views. Recommendation:           - Repeat colonoscopy after studies are complete for                            surveillance based on pathology results.                           - Patient has a contact number available for                            emergencies. The signs and symptoms of potential                            delayed complications were discussed with the                            patient. Return to normal activities tomorrow.  Written discharge instructions were provided to the                            patient.                           - High fiber diet.                           - Continue present medications.                           - Await pathology results. Meryl Dare, MD 11/20/2022 10:36:32 AM This report has been signed electronically.

## 2022-11-20 NOTE — Progress Notes (Signed)
Sedate, gd SR, tolerated procedure well, VSS, report to RN 

## 2022-11-20 NOTE — Progress Notes (Signed)
Called to room to assist during endoscopic procedure.  Patient ID and intended procedure confirmed with present staff. Received instructions for my participation in the procedure from the performing physician.  

## 2022-11-21 ENCOUNTER — Telehealth: Payer: Self-pay

## 2022-11-21 NOTE — Telephone Encounter (Signed)
Patient states there have been no changes to medical or surgical history since time of pre-visit. 

## 2022-11-26 NOTE — Telephone Encounter (Signed)
Details being confirmed for upcoming procedure.

## 2022-12-05 ENCOUNTER — Encounter: Payer: Self-pay | Admitting: Gastroenterology

## 2022-12-28 ENCOUNTER — Other Ambulatory Visit: Payer: Self-pay | Admitting: Cardiology

## 2022-12-30 MED ORDER — LOSARTAN POTASSIUM 50 MG PO TABS: 50.0000 mg | ORAL_TABLET | Freq: Every day | ORAL | 0 refills | Status: DC

## 2023-01-13 ENCOUNTER — Other Ambulatory Visit: Payer: Self-pay | Admitting: Cardiology

## 2023-02-11 ENCOUNTER — Other Ambulatory Visit: Payer: Self-pay | Admitting: Cardiology

## 2023-02-11 MED ORDER — LOSARTAN POTASSIUM 50 MG PO TABS: 50.0000 mg | ORAL_TABLET | Freq: Every day | ORAL | 0 refills | Status: DC

## 2023-02-26 ENCOUNTER — Other Ambulatory Visit: Payer: Self-pay | Admitting: Cardiology

## 2023-03-18 ENCOUNTER — Telehealth: Payer: Self-pay | Admitting: Cardiology

## 2023-03-18 ENCOUNTER — Other Ambulatory Visit: Payer: Self-pay

## 2023-03-18 MED ORDER — LOSARTAN POTASSIUM 50 MG PO TABS: 50.0000 mg | ORAL_TABLET | Freq: Every day | ORAL | 0 refills | Status: DC

## 2023-03-18 NOTE — Telephone Encounter (Signed)
*  STAT* If patient is at the pharmacy, call can be transferred to refill team.   1. Which medications need to be refilled? (please list name of each medication and dose if known)   losartan (COZAAR) 50 MG tablet   2. Which pharmacy/location (including street and city if local pharmacy) is medication to be sent to? [id number], [place] - [address] Phone: [phone]  Fax: [phone]     3. Do they need a 30 day or 90 day supply? 90 Pt has made appt for Dec 3rd

## 2023-03-25 DIAGNOSIS — K219 Gastro-esophageal reflux disease without esophagitis: Secondary | ICD-10-CM | POA: Insufficient documentation

## 2023-03-30 NOTE — Progress Notes (Signed)
 Cardiology Office Note:  .   Date:  03/31/2023  ID:  Shawn Webb, DOB [birth year], MRN [id number] PCP: Wanda Plump, MD  [facility] Providers Cardiologist:  None    History of Present Illness: [name] Kitchen   Shawn Webb is a 47 y.o. male with a past medical history of hypertension, ascending aortic aneurysm, GERD, dyslipidemia, bradycardia.  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 Bing Matter on 07/15/2021, he was doing well from a cardiac perspective, and advised to follow up in 1 year.   He presents today for follow-up of his hypertension.  Has been doing well overall from a cardiac perspective, offers no formal complaints.  He states very active, hiking, playing tennis. He denies chest pain, palpitations, dyspnea, pnd, orthopnea, n, v, dizziness, syncope, edema, weight gain, or early satiety.   ROS: Review of Systems  Cardiovascular:  Positive for leg swelling.  All other systems reviewed and are negative.    Studies Reviewed: .        Cardiac Studies & Procedures     STRESS TESTS  MYOCARDIAL PERFUSION IMAGING 11/08/2019  Narrative  The left ventricular ejection fraction is mildly decreased (45-54%).  Nuclear stress EF: 53%.  There was no ST segment deviation noted during stress.  No T wave inversion was noted during stress.  The study is normal.  This is an intermediate risk study, due to mildly depressed ejection fraction.   ECHOCARDIOGRAM  ECHOCARDIOGRAM COMPLETE 07/29/2021  Narrative ECHOCARDIOGRAM REPORT    Patient Name:   Shawn Webb Date of Exam: 07/29/2021 Medical Rec #:  [id number]       Height:       73.0 in Accession #:    [id number]      Weight:       216.0 lb Date of Birth:  [birth year]         BSA:          2.223 m Patient Age:    47 years        BP:           157/82 mmHg Patient Gender: M               HR:           51 bpm. Exam Location:  [place]  Procedure: 2D Echo, Cardiac Doppler and Color Doppler  Indications:    Dyspnea  History:        Patient has no prior history of Echocardiogram examinations. CT chest 05/07/20. Aneurysm of ascending aorta without rupture (HCC) Dyslipidemia Bradycardia Essential hypertension.  Sonographer:    Roosvelt Maser RDCS Referring Phys: [id number] ROBERT J KRASOWSKI  IMPRESSIONS   1. False chorde in apical LV. Left ventricular ejection fraction, by estimation, is 55 to 60%. The left ventricle has normal function. The left ventricle has no regional wall motion abnormalities. Left ventricular diastolic parameters were normal. 2. Right ventricular systolic function is normal. The right ventricular size is normal. 3. The mitral valve is normal in structure. No evidence of mitral valve regurgitation. No evidence of mitral stenosis. 4. The aortic valve is tricuspid. Aortic valve regurgitation is not visualized. No aortic stenosis is present. 5. Aortic DTA is NWV. There is mild  dilatation of the aortic root and of the ascending aorta, measuring 42 mm. 6. The inferior vena cava is normal in size with greater than 50% respiratory variability, suggesting right atrial pressure of 3 mmHg.  FINDINGS Left Ventricle: False chorde in apical LV. Left ventricular ejection fraction, by estimation, is 55 to 60%. The left ventricle has normal function. The left ventricle has no regional wall motion abnormalities. The left ventricular internal cavity size was normal in size. There is no left ventricular hypertrophy. Left ventricular diastolic parameters were normal. Normal left ventricular filling pressure.  Right Ventricle: The right ventricular size is normal. No increase in right ventricular wall thickness. Right ventricular systolic function is  normal.  Left Atrium: Left atrial size was normal in size.  Right Atrium: Right atrial size was normal in size.  Pericardium: There is no evidence of pericardial effusion.  Mitral Valve: The mitral valve is normal in structure. No evidence of mitral valve regurgitation. No evidence of mitral valve stenosis.  Tricuspid Valve: The tricuspid valve is normal in structure. Tricuspid valve regurgitation is not demonstrated. No evidence of tricuspid stenosis.  Aortic Valve: The aortic valve is tricuspid. Aortic valve regurgitation is not visualized. No aortic stenosis is present. Aortic valve mean gradient measures 6.0 mmHg. Aortic valve peak gradient measures 10.5 mmHg.  Pulmonic Valve: The pulmonic valve was normal in structure. Pulmonic valve regurgitation is trivial. No evidence of pulmonic stenosis.  Aorta: The aortic arch was not well visualized, the aortic root is normal in size and structure and DTA is NWV. There is mild dilatation of the aortic root and of the ascending aorta, measuring 42 mm.  Venous: The pulmonary veins were not well visualized. The inferior vena cava is normal in size with greater than 50% respiratory variability, suggesting right atrial pressure of 3 mmHg.  IAS/Shunts: No atrial level shunt detected by color flow Doppler.   LEFT VENTRICLE PLAX 2D LVIDd:         5.40 cm Diastology LVIDs:         3.40 cm LV e' medial:    11.20 cm/s LV PW:         1.00 cm LV E/e' medial:  5.7 LV IVS:        0.90 cm LV e' lateral:   13.80 cm/s LV E/e' lateral: 4.6   RIGHT VENTRICLE RV Basal diam:  3.40 cm RV S prime:     12.10 cm/s TAPSE (M-mode): 2.7 cm  LEFT ATRIUM             Index        RIGHT ATRIUM           Index LA diam:        4.40 cm 1.98 cm/m   RA Area:     20.70 cm LA Vol (A2C):   63.5 ml 28.56 ml/m  RA Volume:   56.70 ml  25.50 ml/m LA Vol (A4C):   71.1 ml 31.98 ml/m LA Biplane Vol: 71.4 ml 32.12 ml/m AORTIC VALVE AV Vmax:           162.00 cm/s AV  Vmean:          112.000 cm/s AV VTI:            0.369 m AV Peak Grad:      10.5 mmHg AV Mean Grad:      6.0 mmHg LVOT Vmax:         112.00 cm/s LVOT Vmean:  78.800 cm/s LVOT VTI:          0.246 m LVOT/AV VTI ratio: 0.67  AORTA Ao Root diam: 3.90 cm Ao Asc diam:  4.20 cm  MITRAL VALVE MV Area (PHT): 2.92 cm    SHUNTS MV Decel Time: 260 msec    Systemic VTI: 0.25 m MV E velocity: 63.80 cm/s MV A velocity: 59.60 cm/s MV E/A ratio:  1.07  Norman Herrlich MD Electronically signed by Norman Herrlich MD Signature Date/Time: 07/29/2021/7:18:45 PM    Final    MONITORS  LONG TERM MONITOR (3-14 DAYS) 07/31/2020  Narrative Patch Wear Time:  6 days and 17 hours (2022-03-22T11:45:46-0400 to 2022-03-29T05:26:29-0400)  Patient had a min HR of 31 bpm, max HR of 153 bpm, and avg HR of 55 bpm. Predominant underlying rhythm was Sinus Rhythm. Isolated SVEs were rare (<1.0%), and no SVE Couplets or SVE Triplets were present. Isolated VEs were rare (<1.0%), VE Couplets were rare (<1.0%), and no VE Triplets were present. Ventricular Bigeminy was present.   Summary conclusions: Supraventricular tach trickle ectopy noted. Periods of ventricular trigeminy noted. Triggered events showing majority of time sinus rhythm however 2 showed PVCs           Risk Assessment/Calculations:     HYPERTENSION CONTROL Vitals:   03/31/23 0824 03/31/23 0915  BP: (!) 148/88 (!) 148/88    The patient's blood pressure is elevated above target today.  In order to address the patient's elevated BP: Blood pressure will be monitored at home to determine if medication changes need to be made.      STOP-Bang Score:  5      Physical Exam:   VS:  BP (!) 148/88   Pulse (!) 54   Ht 6\' 1"  (1.854 m)   Wt 235 lb (106.6 kg)   SpO2 97%   BMI 31.00 kg/m    Wt Readings from Last 3 Encounters:  03/31/23 235 lb (106.6 kg)  11/20/22 225 lb (102.1 kg)  10/24/22 225 lb (102.1 kg)    GEN: Well nourished, well developed  in no acute distress NECK: No JVD; No carotid bruits CARDIAC: RRR, no murmurs, rubs, gallops RESPIRATORY:  Clear to auscultation without rales, wheezing or rhonchi  ABDOMEN: Soft, non-tender, non-distended EXTREMITIES: Trace pedal edema around ankle; No deformity   ASSESSMENT AND PLAN: .   Hypertension-blood pressure is elevated in the office, states it is typically around this when he checks it at home.  Currently taking his blood pressure medications at night.  We will have him take his blood pressure medicines in the morning, keep a blood pressure log for 2 weeks.  For now continue Norvasc 5 mg daily, continue Cozaar 50 mg daily--if his blood pressure is not optimally controlled plan increase in his Cozaar is a currently having some pedal edema. Will check BMET.   Ascending aortic dilatation-4.1 cm per CT of the chest in 2022 >> 4.2 cm per echo in 2023.  Snoring-STOP-BANG score of 5, will arrange for an at home Itamar sleep study.  He already has relatively good sleeping hygiene, not overweight, does not use excess alcohol.  Dyslipidemia-most recent LDL was elevated at 131, this appears to be monitored by his PCP, offered to recheck today but he is comfortable waiting until he sees his PCP again. Moderate intensity statin should be considered.        Dispo: BMET today, BP log x 2 weeks, Itamar for snoring. Follow up in 1 year as he also follows closely  with his PCP.   Signed, Flossie Dibble, NP

## 2023-03-31 ENCOUNTER — Ambulatory Visit: Payer: BC Managed Care – PPO | Attending: Cardiology | Admitting: Cardiology

## 2023-03-31 ENCOUNTER — Telehealth: Payer: Self-pay | Admitting: Emergency Medicine

## 2023-03-31 ENCOUNTER — Encounter: Payer: Self-pay | Admitting: Cardiology

## 2023-03-31 VITALS — BP 148/88 | HR 54 | Ht 73.0 in | Wt 235.0 lb

## 2023-03-31 DIAGNOSIS — I7121 Aneurysm of the ascending aorta, without rupture: Secondary | ICD-10-CM

## 2023-03-31 DIAGNOSIS — I10 Essential (primary) hypertension: Secondary | ICD-10-CM | POA: Diagnosis not present

## 2023-03-31 DIAGNOSIS — E785 Hyperlipidemia, unspecified: Secondary | ICD-10-CM

## 2023-03-31 DIAGNOSIS — R001 Bradycardia, unspecified: Secondary | ICD-10-CM | POA: Diagnosis not present

## 2023-03-31 DIAGNOSIS — R0683 Snoring: Secondary | ICD-10-CM

## 2023-03-31 NOTE — Patient Instructions (Addendum)
Please keep a BP log for 2 weeks and send by MyChart or mail.                         Name and DOB__________________________   Blood Pressure Record Sheet To take your blood pressure, you will need a blood pressure machine. You can buy a blood pressure machine (blood pressure monitor) at your clinic, drug store, or online. When choosing one, consider: An automatic monitor that has an arm cuff. A cuff that wraps snugly around your upper arm. You should be able to fit only one finger between your arm and the cuff. A device that stores blood pressure reading results. Do not choose a monitor that measures your blood pressure from your wrist or finger. Follow your health care provider's instructions for how to take your blood pressure. To use this form: Get one reading in the morning (a.m.) 1-2 hours after you take any medicines. Get one reading in the evening (p.m.) before supper.   Blood pressure log Date: _______________________  a.m. _____________________(1st reading) HR___________            p.m. _____________________(2nd reading) HR__________  Date: _______________________  a.m. _____________________(1st reading) HR___________            p.m. _____________________(2nd reading) HR__________  Date: _______________________  a.m. _____________________(1st reading) HR___________            p.m. _____________________(2nd reading) HR__________  Date: _______________________  a.m. _____________________(1st reading) HR___________            p.m. _____________________(2nd reading) HR__________  Date: _______________________  a.m. _____________________(1st reading) HR___________            p.m. _____________________(2nd reading) HR__________  Date: _______________________  a.m. _____________________(1st reading) HR___________            p.m. _____________________(2nd reading) HR__________  Date: _______________________  a.m. _____________________(1st reading)  HR___________            p.m. _____________________(2nd reading) HR__________   This information is not intended to replace advice given to you by your health care provider. Make sure you discuss any questions you have with your health care provider. Document Revised: 08/03/2019 Document Reviewed: 08/03/2019 Elsevier Patient Education  2021 Elsevier Inc.   Medication Instructions:   Start taking your blood pressure medications in the morning.    *If you need a refill on your cardiac medications before your next appointment, please call your pharmacy*   Lab Work:  Your physician recommends that you have labs done in the office today. Your test included  basic metabolic panel.   If you have labs (blood work) drawn today and your tests are completely normal, you will receive your results only by: MyChart Message (if you have MyChart) OR A paper copy in the mail If you have any lab test that is abnormal or we need to change your treatment, we will call you to review the results.   Testing/Procedures: Patient agreement reviewed and signed on 03/31/2023.  WatchPAT issued to patient on 03/31/2023 by Lonia Farber, RN. Patient aware to not open the WatchPAT box until contacted with the activation PIN. Patient profile initialized in CloudPAT on 03/31/2023 by Roosvelt Harps Device serial number: [id number]  Please list Reason for Call as Advice Only and type "WatchPAT issued to patient" in the comment box.   Follow-Up: At [facility], you and your health needs are our priority.  As part of our continuing mission to provide you with exceptional  heart care, we have created designated Provider Care Teams.  These Care Teams include your primary Cardiologist (physician) and Advanced Practice Providers (APPs -  Physician Assistants and Nurse Practitioners) who all work together to provide you with the care you need, when you need it.  We recommend signing up for the patient portal  called "MyChart".  Sign up information is provided on this After Visit Summary.  MyChart is used to connect with patients for Virtual Visits (Telemedicine).  Patients are able to view lab/test results, encounter notes, upcoming appointments, etc.  Non-urgent messages can be sent to your provider as well.   To learn more about what you can do with MyChart, go to [email or web address].    Your next appointment:   12 month(s)  Provider:   Gypsy Balsam, MD

## 2023-03-31 NOTE — Telephone Encounter (Signed)
Patient agreement reviewed and signed on 03/31/2023.  WatchPAT issued to patient on 03/31/2023 by Lonia Farber, RN. Patient aware to not open the WatchPAT box until contacted with the activation PIN. Patient profile initialized in CloudPAT on 03/31/2023 by Roosvelt Harps Device serial number: [id number]  Please list Reason for Call as Advice Only and type "WatchPAT issued to patient" in the comment box.

## 2023-04-01 ENCOUNTER — Telehealth: Payer: Self-pay | Admitting: Emergency Medicine

## 2023-04-01 LAB — BASIC METABOLIC PANEL WITH GFR
BUN/Creatinine Ratio: 13 (ref 9–20)
BUN: 14 mg/dL (ref 6–24)
CO2: 27 mmol/L (ref 20–29)
Calcium: 9.9 mg/dL (ref 8.7–10.2)
Chloride: 103 mmol/L (ref 96–106)
Creatinine, Ser: 1.09 mg/dL (ref 0.76–1.27)
Glucose: 95 mg/dL (ref 70–99)
Potassium: 4.5 mmol/L (ref 3.5–5.2)
Sodium: 141 mmol/L (ref 134–144)
eGFR: 85 mL/min/1.73

## 2023-04-01 NOTE — Telephone Encounter (Signed)
-----   Message from Flossie Dibble sent at 04/01/2023  8:32 AM EST ----- Labs look good, normal electrolytes and kidney function.

## 2023-04-01 NOTE — Telephone Encounter (Addendum)
Patient viewed message from [facility] regarding lab results in his my chart.  Routed to PCP

## 2023-05-11 ENCOUNTER — Other Ambulatory Visit: Payer: Self-pay | Admitting: Cardiology

## 2023-05-11 MED ORDER — LOSARTAN POTASSIUM 50 MG PO TABS: 50.0000 mg | ORAL_TABLET | Freq: Two times a day (BID) | ORAL | 2 refills | Status: DC

## 2023-06-13 ENCOUNTER — Other Ambulatory Visit: Payer: Self-pay | Admitting: Cardiology

## 2023-07-13 ENCOUNTER — Encounter: Payer: Self-pay | Admitting: Internal Medicine

## 2023-07-13 ENCOUNTER — Ambulatory Visit (INDEPENDENT_AMBULATORY_CARE_PROVIDER_SITE_OTHER): Payer: Commercial Managed Care - PPO | Admitting: Internal Medicine

## 2023-07-13 VITALS — BP 126/68 | HR 50 | Temp 98.0°F | Resp 16 | Ht 73.0 in | Wt 236.1 lb

## 2023-07-13 DIAGNOSIS — Z0000 Encounter for general adult medical examination without abnormal findings: Secondary | ICD-10-CM

## 2023-07-13 DIAGNOSIS — I10 Essential (primary) hypertension: Secondary | ICD-10-CM | POA: Diagnosis not present

## 2023-07-13 DIAGNOSIS — E785 Hyperlipidemia, unspecified: Secondary | ICD-10-CM | POA: Diagnosis not present

## 2023-07-13 LAB — ALT: ALT: 37 U/L (ref 0–53)

## 2023-07-13 LAB — CBC WITH DIFFERENTIAL/PLATELET
Basophils Absolute: 0 10*3/uL (ref 0.0–0.1)
Basophils Relative: 0.7 % (ref 0.0–3.0)
Eosinophils Absolute: 0.3 10*3/uL (ref 0.0–0.7)
Eosinophils Relative: 5.8 % — ABNORMAL HIGH (ref 0.0–5.0)
HCT: 44.8 % (ref 39.0–52.0)
Hemoglobin: 14.9 g/dL (ref 13.0–17.0)
Lymphocytes Relative: 35.7 % (ref 12.0–46.0)
Lymphs Abs: 1.6 10*3/uL (ref 0.7–4.0)
MCHC: 33.4 g/dL (ref 30.0–36.0)
MCV: 90.4 fl (ref 78.0–100.0)
Monocytes Absolute: 0.3 10*3/uL (ref 0.1–1.0)
Monocytes Relative: 6.9 % (ref 3.0–12.0)
Neutro Abs: 2.3 10*3/uL (ref 1.4–7.7)
Neutrophils Relative %: 50.9 % (ref 43.0–77.0)
Platelets: 149 10*3/uL — ABNORMAL LOW (ref 150.0–400.0)
RBC: 4.95 Mil/uL (ref 4.22–5.81)
RDW: 14 % (ref 11.5–15.5)
WBC: 4.6 10*3/uL (ref 4.0–10.5)

## 2023-07-13 LAB — LIPID PANEL
Cholesterol: 187 mg/dL (ref 0–200)
HDL: 36.5 mg/dL — ABNORMAL LOW (ref >39.00)
LDL Cholesterol: 116 mg/dL — ABNORMAL HIGH (ref 0–99)
NonHDL: 150.75
Total CHOL/HDL Ratio: 5
Triglycerides: 174 mg/dL — ABNORMAL HIGH (ref 0.0–149.0)
VLDL: 34.8 mg/dL (ref 0.0–40.0)

## 2023-07-13 LAB — AST: AST: 19 U/L (ref 0–37)

## 2023-07-13 NOTE — Assessment & Plan Note (Signed)
 Here for CPX  Chronic medical problems reviewed. HTN: On amlodipine 10 mg, cardiology increase losartan to twice daily, ambulatory BPs are now very good.  Check labs. Dyslipidemia: Currently diet controlled, cardiology suggested moderate statins, checking a FLP.  Patient states he is inclined to take statins if needed.   Snoring: Cardiology ordered a sleep study, plans to proceed. Ascending aortic dilatation: Per cardiology.   RTC 1 year depending on results.

## 2023-07-13 NOTE — Assessment & Plan Note (Signed)
 Here for CPX -Tdap 2018 - flu shot : utd - rec covid booster  CCS: FH colon polyps M at age 48 y/o; Cscope 10/2022, next per GI - prostrate ca screening: not indicated  -Labs:  AST ALT FLP CBC -Diet and exercise: Doing well, very active, hiking frequently.

## 2023-07-13 NOTE — Patient Instructions (Signed)
  Check the  blood pressure regularly Blood pressure goal:  between 110/65 and  135/85. If it is consistently higher or lower, let me know     GO TO THE LAB : Get the blood work     Please go to the front desk: Arrange for a physical exam in 1 year

## 2023-07-13 NOTE — Progress Notes (Signed)
 Subjective:    Patient ID: Shawn Webb, male    DOB: [birth year], 48 y.o.   MRN: [id number]  DOS:  07/13/2023 Type of visit - description: CPX  Here for CPX, feeling well.  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   Chest pain of uncertain etiology 08/17/2019   Chicken pox    Dyslipidemia 08/17/2019   Essential hypertension 08/17/2019   GERD (gastroesophageal reflux disease)    Hypertension     Past Surgical History:  Procedure Laterality Date   LUMBAR LAMINECTOMY/DECOMPRESSION MICRODISCECTOMY Left 08/15/2020   Procedure: LEFT-SIDED LUMBAR 5 - SACRUM 1 MICRODISECTOMY;  Surgeon: Estill Bamberg, MD;  Location: [facility] OR;  Service: Orthopedics;  Laterality: Left;   Social History   Socioeconomic History   Marital status: Married    Spouse name: Not on file   Number of children: 2   Years of education: Not on file   Highest education level: Not on file  Occupational History   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       Social Drivers of Corporate investment banker Strain: Not on file  Food Insecurity: Not on file  Transportation Needs: Not on file  Physical Activity: Not on file  Stress: Not on file  Social Connections: Not on file  Intimate Partner Violence: Not on file    Current Outpatient Medications  Medication  Instructions   amLODipine (NORVASC) 10 mg, Oral, Daily   cetirizine (ZYRTEC) 5 mg, Daily   losartan (COZAAR) 50 mg, Oral, 2 times daily   TURMERIC PO Take by mouth.       Objective:   Physical Exam BP 126/68   Pulse (!) 50   Temp 98 F (36.7 C) (Oral)   Resp 16   Ht 6\' 1"  (1.854 m)   Wt 236 lb 2 oz (107.1 kg)   SpO2 95%   BMI 31.15 kg/m  General: Well developed, NAD, BMI noted Neck: No  thyromegaly  HEENT:  Normocephalic . Face symmetric, atraumatic Lungs:  CTA B Normal respiratory effort, no intercostal retractions, no accessory muscle use. Heart: RRR,  no murmur.  Abdomen:  Not distended, soft, non-tender. No rebound or rigidity.   Lower extremities: no pretibial edema bilaterally  Skin: Exposed areas without rash. Not pale. Not jaundice Neurologic:  alert & oriented X3.  Speech normal, gait appropriate for age and unassisted Strength symmetric and appropriate for age.  Psych: Cognition and judgment appear intact.  Cooperative with normal attention span and concentration.  Behavior appropriate. No anxious or depressed appearing.     Assessment   Asssessment HTN Mild thrombocytopenia Allergic rhinitis Rosacea: face Eczema (elbows) CV: --Ascending aortic dilatation, chest pain: (-) Stress test 10-2019.     --  CT chest 05/2020 : Aneurysm 4.1 cm  PLAN: Here for CPX -Tdap 2018 - flu shot : utd - rec covid booster  CCS: FH colon polyps M at age 24 y/o; Cscope 10/2022, next per GI - prostrate ca screening: not indicated  -Labs:  AST ALT FLP CBC -Diet and exercise: Doing well, very active, hiking frequently. Chronic medical problems reviewed. HTN: On amlodipine 10 mg, cardiology increase losartan to twice daily, ambulatory BPs are now very good.  Check labs. Dyslipidemia: Currently diet controlled, cardiology suggested moderate statins, checking a FLP.  Patient states he is inclined to take statins if needed.   Snoring: Cardiology ordered a sleep study, plans  to proceed. Ascending aortic dilatation: Per cardiology.   RTC 1 year depending on results.

## 2023-07-15 ENCOUNTER — Encounter: Payer: Self-pay | Admitting: Internal Medicine

## 2023-08-12 ENCOUNTER — Other Ambulatory Visit: Payer: Self-pay | Admitting: Internal Medicine

## 2023-08-12 NOTE — Telephone Encounter (Signed)
 Copied from CRM [id number]. Topic: Clinical - Medication Refill >> Aug 12, 2023  9:48 AM Ovid Blow wrote: Most Recent Primary Care Visit:  Provider: Devonna Foley E  Department: [facility]  Visit Type: PHYSICAL  Date: 07/13/2023  Medication: amLODipine (NORVASC) 5 MG tablet    Has the patient contacted their pharmacy? Yes (Agent: If no, request that the patient contact the pharmacy for the refill. If patient does not wish to contact the pharmacy document the reason why and proceed with request.) (Agent: If yes, when and what did the pharmacy advise?)  Is this the correct pharmacy for this prescription? Yes If no, delete pharmacy and type the correct one.  This is the patient's preferred pharmacy:  [id number] - [place], [place] - [address] [place] [address] Phone: [phone] Fax: [phone]   Has the prescription been filled recently? No  Is the patient out of the medication? No  Has the patient been seen for an appointment in the last year OR does the patient have an upcoming appointment? Yes  Can we respond through MyChart? Yes  Agent: Please be advised that Rx refills may take up to 3 business days. We ask that you follow-up with your pharmacy.

## 2023-08-19 ENCOUNTER — Other Ambulatory Visit: Payer: Self-pay | Admitting: Internal Medicine

## 2023-08-19 MED ORDER — AMLODIPINE BESYLATE 5 MG PO TABS: 10.0000 mg | ORAL_TABLET | Freq: Every day | ORAL | 2 refills | Status: DC

## 2023-08-19 NOTE — Telephone Encounter (Signed)
 Copied from CRM [id number]. Topic: Clinical - Medication Refill >> Aug 19, 2023  9:27 AM Bambi Bonine D wrote: Most Recent Primary Care Visit:  Provider: Devonna Foley E  Department: [facility]  Visit Type: PHYSICAL  Date: 07/13/2023  Medication: amLODipine  (NORVASC ) 5 MG tablet  Has the patient contacted their pharmacy? Yes (Agent: If no, request that the patient contact the pharmacy for the refill. If patient does not wish to contact the pharmacy document the reason why and proceed with request.) (Agent: If yes, when and what did the pharmacy advise?)  Is this the correct pharmacy for this prescription? Yes If no, delete pharmacy and type the correct one.  This is the patient's preferred pharmacy:  [id number] - [place], [place] - [address] [place]  [address] Phone: [phone] Fax: [phone]   Has the prescription been filled recently? No  Is the patient out of the medication? No  Has the patient been seen for an appointment in the last year OR does the patient have an upcoming appointment? Yes  Can we respond through MyChart? Yes  Agent: Please be advised that Rx refills may take up to 3 business days. We ask that you follow-up with your pharmacy.

## 2023-08-19 NOTE — Telephone Encounter (Signed)
 Duplicated, already ordered this morning.

## 2023-09-29 ENCOUNTER — Ambulatory Visit (INDEPENDENT_AMBULATORY_CARE_PROVIDER_SITE_OTHER): Admitting: Internal Medicine

## 2023-09-29 VITALS — BP 122/80 | HR 63 | Temp 97.9°F | Resp 16 | Ht 73.0 in | Wt 243.0 lb

## 2023-09-29 DIAGNOSIS — R635 Abnormal weight gain: Secondary | ICD-10-CM

## 2023-09-29 DIAGNOSIS — R739 Hyperglycemia, unspecified: Secondary | ICD-10-CM

## 2023-09-29 NOTE — Patient Instructions (Signed)
 Continue exercising as you are doing  Watch your diet more closely, cut down on carbohydrates  For more information about diet and A1c, please visit the American diabetes Association website  Schedule a follow-up for March 2026 for a physical exam.  Will need to see you sooner depending on the results

## 2023-09-29 NOTE — Progress Notes (Unsigned)
   Subjective:    Patient ID: Shawn Webb, male    DOB: 06/29/1975, 48 y.o.   MRN: [id number]  DOS:  09/29/2023 Type of visit - description: Follow-up  The patient received a letter from the [facility] stating that his screening for diabetes came back positive, A1c was elevated.  The patient is feeling well. On chart review no previous hyperglycemia. I did notice weight gain.  Wt Readings from Last 3 Encounters:  09/29/23 243 lb (110.2 kg)  07/13/23 236 lb 2 oz (107.1 kg)  03/31/23 235 lb (106.6 kg)     Review of Systems See above   Past Medical History:  Diagnosis Date   Allergic rhinitis 11/21/2011   Allergy    Annual physical exam 11/21/2011   Anxiety 07/26/2020   Ascending aortic aneurysm (HCC) 11/29/2019   Bradycardia 07/17/2020   Chest pain of uncertain etiology 08/17/2019   Chicken pox    Dyslipidemia 08/17/2019   Essential hypertension 08/17/2019   GERD (gastroesophageal reflux disease)    Hypertension     Past Surgical History:  Procedure Laterality Date   LUMBAR LAMINECTOMY/DECOMPRESSION MICRODISCECTOMY Left 08/15/2020   Procedure: LEFT-SIDED LUMBAR 5 - SACRUM 1 MICRODISECTOMY;  Surgeon: Virl Grimes, MD;  Location: [facility] OR;  Service: Orthopedics;  Laterality: Left;    Current Outpatient Medications  Medication Instructions   amLODipine  (NORVASC ) 10 mg, Oral, Daily   cetirizine (ZYRTEC) 5 mg, Daily   losartan  (COZAAR ) 50 mg, Oral, 2 times daily   Psyllium 100 % PACK    TURMERIC PO Take by mouth.       Objective:   Physical Exam BP 122/80   Pulse 63   Temp 97.9 F (36.6 C) (Oral)   Resp 16   Ht 6\' 1"  (1.854 m)   Wt 243 lb (110.2 kg)   SpO2 96%   BMI 32.06 kg/m  General:   Well developed, NAD, BMI noted. HEENT:  Normocephalic . Face symmetric, atraumatic Skin: Not pale. Not jaundice Neurologic:  alert & oriented X3.  Speech normal, gait appropriate for age and unassisted Psych--  Cognition and judgment appear intact.  Cooperative  with normal attention span and concentration.  Behavior appropriate. No anxious or depressed appearing.      Assessment     Asssessment HTN Mild thrombocytopenia Allergic rhinitis Rosacea: face Eczema (elbows) CV: --Ascending aortic dilatation, chest pain: (-) Stress test 10-2019.     --CT chest 05/2020 : Aneurysm 4.1 cm  PLAN: Hyperglycemia: Reportedly, A1c done at the [facility] was elevated, the patient does not have the exact results. We had a long conversation about the A1c, what that means. He is extremely active, walks 1.5 miles daily, hikes 5 to 6 miles in the weekends. Admits to eating a lot of carbohydrates. Plan: A1c, moderate carbohydrate intake, continue physical exertion as he is doing.  Further about results. Weight gain: Noted today, see above Snoring: Has not pursue sleep study, explained patient the importance of rule out a sleep apnea. RTC 06/2024 for a physical exam, sooner depending on results.

## 2023-09-30 LAB — HEMOGLOBIN A1C: Hgb A1c MFr Bld: 5.9 % (ref 4.6–6.5)

## 2023-09-30 NOTE — Assessment & Plan Note (Signed)
 Hyperglycemia: Reportedly, A1c done at the [facility] was elevated, the patient does not have the exact results. We had a long conversation about the A1c, what that means. He is extremely active, walks 1.5 miles daily, hikes 5 to 6 miles in the weekends. Admits to eating a lot of carbohydrates. Plan: A1c, moderate carbohydrate intake, continue physical exertion as he is doing.  Further about results. Weight gain: Noted today, see above Snoring: Has not pursue sleep study, explained patient the importance of rule out a sleep apnea. RTC 06/2024 for a physical exam, sooner depending on results.

## 2023-10-02 ENCOUNTER — Ambulatory Visit: Payer: Self-pay | Admitting: Internal Medicine

## 2023-12-31 ENCOUNTER — Telehealth: Payer: Self-pay

## 2023-12-31 NOTE — Telephone Encounter (Addendum)
**Note De-Identified Kimberely Mccannon Obfuscation** Ordering provider: Delon Hoover, NP Associated diagnoses: Snoring-R06.83 and Somnolence-R40.0 WatchPAT PA obtained on 12/31/2023 by Axie Hayne, Avelina HERO, LPN. Authorization: Per the [employer] provider portal a PA is not required for CPT Code: 95800-Itamar-HST  Patient notified of PIN ([id number]) on 12/31/2023 Raylan Hanton Phone-No answer so I left a message on his VM asking him to call Macario back at [facility] at [phone].   Phone note routed to covering staff for follow-up.

## 2024-01-01 NOTE — Telephone Encounter (Signed)
 Billing is checking into this issue.

## 2024-02-02 ENCOUNTER — Other Ambulatory Visit: Payer: Self-pay | Admitting: Cardiology

## 2024-04-29 ENCOUNTER — Other Ambulatory Visit: Payer: Self-pay

## 2024-04-29 MED ORDER — LOSARTAN POTASSIUM 50 MG PO TABS: 50.0000 mg | ORAL_TABLET | Freq: Two times a day (BID) | ORAL | 0 refills | Status: DC

## 2024-05-03 ENCOUNTER — Other Ambulatory Visit: Payer: Self-pay | Admitting: Internal Medicine

## 2024-05-03 MED ORDER — AMLODIPINE BESYLATE 10 MG PO TABS: 10.0000 mg | ORAL_TABLET | Freq: Every day | ORAL | 1 refills | Status: AC

## 2024-05-03 NOTE — Telephone Encounter (Signed)
Yes, okay to change

## 2024-05-03 NOTE — Telephone Encounter (Signed)
 Insurance no longer wanting to cover 2 of the amlodipine  5mg  daily, okay to change to 10mg  tablet?

## 2024-05-31 ENCOUNTER — Other Ambulatory Visit: Payer: Self-pay | Admitting: Cardiology

## 2024-07-13 ENCOUNTER — Encounter: Admitting: Internal Medicine
